# Patient Record
Sex: Female | Born: 1975 | Race: Black or African American | Hispanic: No | Marital: Single | Smoking: Never smoker
Health system: Southern US, Community
[De-identification: ages and names within clinical notes are randomized; demographics above are authoritative.]

## PROBLEM LIST (undated history)

## (undated) DIAGNOSIS — I10 Essential (primary) hypertension: Secondary | ICD-10-CM

## (undated) DIAGNOSIS — O139 Gestational [pregnancy-induced] hypertension without significant proteinuria, unspecified trimester: Secondary | ICD-10-CM

## (undated) DIAGNOSIS — D649 Anemia, unspecified: Secondary | ICD-10-CM

## (undated) DIAGNOSIS — D219 Benign neoplasm of connective and other soft tissue, unspecified: Secondary | ICD-10-CM

---

## 2010-05-11 ENCOUNTER — Emergency Department (HOSPITAL_COMMUNITY)
Admission: EM | Admit: 2010-05-11 | Discharge: 2010-05-11 | Payer: Self-pay | Source: Home / Self Care | Admitting: Family Medicine

## 2010-05-12 ENCOUNTER — Encounter (INDEPENDENT_AMBULATORY_CARE_PROVIDER_SITE_OTHER): Payer: Self-pay | Admitting: Nurse Practitioner

## 2010-06-30 ENCOUNTER — Encounter: Payer: Self-pay | Admitting: Nurse Practitioner

## 2010-06-30 ENCOUNTER — Ambulatory Visit: Admit: 2010-06-30 | Payer: Self-pay | Admitting: Nurse Practitioner

## 2010-06-30 DIAGNOSIS — K029 Dental caries, unspecified: Secondary | ICD-10-CM | POA: Insufficient documentation

## 2010-07-01 NOTE — Letter (Signed)
Summary: DENTAL REFERRAL  DENTAL REFERRAL   Imported By: Arta Bruce 05/12/2010 16:37:26  _____________________________________________________________________  External Attachment:    Type:   Image     Comment:   External Document

## 2010-07-03 ENCOUNTER — Inpatient Hospital Stay (INDEPENDENT_AMBULATORY_CARE_PROVIDER_SITE_OTHER)
Admission: RE | Admit: 2010-07-03 | Discharge: 2010-07-03 | Disposition: A | Discharge status: Home / Self Care | Payer: Self-pay | Source: Ambulatory Visit | Attending: Internal Medicine | Admitting: Internal Medicine

## 2010-07-03 DIAGNOSIS — L02419 Cutaneous abscess of limb, unspecified: Secondary | ICD-10-CM

## 2010-07-07 LAB — CULTURE, ROUTINE-ABSCESS: Gram Stain: NONE SEEN

## 2010-07-07 NOTE — Letter (Signed)
Summary: DENTAL REFERRAL  DENTAL REFERRAL   Imported By: Arta Bruce 07/01/2010 10:15:37  _____________________________________________________________________  External Attachment:    Type:   Image     Comment:   External Document
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
## 2011-05-30 ENCOUNTER — Emergency Department (HOSPITAL_COMMUNITY)
Admission: EM | Admit: 2011-05-30 | Discharge: 2011-05-30 | Disposition: A | Discharge status: Home / Self Care | Payer: Self-pay | Source: Home / Self Care

## 2011-05-30 ENCOUNTER — Encounter: Payer: Self-pay | Admitting: *Deleted

## 2011-05-30 DIAGNOSIS — J309 Allergic rhinitis, unspecified: Secondary | ICD-10-CM

## 2011-05-30 DIAGNOSIS — J209 Acute bronchitis, unspecified: Secondary | ICD-10-CM

## 2011-05-30 MED ORDER — BENZONATATE 100 MG PO CAPS: ORAL_CAPSULE | ORAL | Status: AC

## 2011-05-30 MED ORDER — PREDNISONE 20 MG PO TABS: 20.0000 mg | ORAL_TABLET | Freq: Every day | ORAL | Status: AC

## 2011-05-30 NOTE — ED Provider Notes (Signed)
Medical screening examination/treatment/procedure(s) were performed by non-physician practitioner and as supervising physician I was immediately available for consultation/collaboration.  Pragya Lofaso   Mylik Pro, MD 05/30/11 1048 

## 2011-05-30 NOTE — ED Notes (Signed)
Pt states she had a cough back in November, got better, but then last week started with sinus congestion and cough again.  Denies fever, sorethroat.  States when she coughs, she sometimes gets [place] clear phlegm.

## 2011-05-30 NOTE — ED Provider Notes (Signed)
History     CSN: [id number]  Arrival date & time 05/30/11  8657   None     Chief Complaint  Patient presents with  . Cough    (Consider location/radiation/quality/duration/timing/severity/associated sxs/prior treatment) HPI Comments: Pt states she has been sick off and on with cough and nasal congestion since mid November. Cough returned over a week ago. Is mostly nonproductive but occasionally is productive with clear phlegm. She also has nasal congestion. No fever, chills or sore throat. She has been taking otc cold and cough medications with some relief. She denies dyspnea or wheezing.   The history is provided by the patient.    History reviewed. No pertinent past medical history.  History reviewed. No pertinent past surgical history.  History reviewed. No pertinent family history.  History  Substance Use Topics  . Smoking status: Never Smoker   . Smokeless tobacco: Not on file  . Alcohol Use: No    OB History    Grav Para Term Preterm Abortions TAB SAB Ect Mult Living                  Review of Systems  Constitutional: Negative for fever, chills and fatigue.  HENT: Positive for congestion, rhinorrhea and postnasal drip. Negative for ear pain, sore throat, sneezing and sinus pressure.   Respiratory: Positive for cough. Negative for shortness of breath and wheezing.   Cardiovascular: Negative for chest pain and palpitations.    Allergies  Sulfonamide derivatives  Home Medications   Current Outpatient Rx  Name Route Sig Dispense Refill  . ALKA-SELTZER PLUS COLD PO Oral Take by mouth.      . SUDAFED COUGH PO Oral Take by mouth.      . BENZONATATE 100 MG PO CAPS  1-2 caps every 8 hrs prn cough 30 capsule 0  . PREDNISONE 20 MG PO TABS Oral Take 1 tablet (20 mg total) by mouth daily. 5 tablet 0    BP 126/80  Pulse 72  Temp(Src) 98.2 F (36.8 C) (Oral)  Resp 16  SpO2 100%  LMP 05/23/2011  Physical Exam  Nursing note and vitals  reviewed. Constitutional: She appears well-developed and well-nourished. No distress.  HENT:  Head: Normocephalic and atraumatic.  Right Ear: Tympanic membrane, external ear and ear canal normal.  Left Ear: Tympanic membrane, external ear and ear canal normal.  Nose: Mucosal edema (severely swollen and pale bilat) present. No rhinorrhea, nasal deformity or septal deviation.  Mouth/Throat: Uvula is midline, oropharynx is clear and moist and mucous membranes are normal. No oropharyngeal exudate, posterior oropharyngeal edema or posterior oropharyngeal erythema.  Neck: Neck supple.  Cardiovascular: Normal rate, regular rhythm and normal heart sounds.   Pulmonary/Chest: Effort normal and breath sounds normal. No respiratory distress.  Lymphadenopathy:    She has no cervical adenopathy.  Neurological: She is alert.  Skin: Skin is warm and dry.  Psychiatric: She has a normal mood and affect.    ED Course  Procedures (including critical care time)  Labs Reviewed - No data to display No results found.   1. Acute bronchitis   2. Allergic rhinitis       MDM  Recurrent cough and allergic rhinitis.         Melody Comas, [place] 05/30/11 1044

## 2012-01-15 ENCOUNTER — Encounter (HOSPITAL_COMMUNITY): Payer: Self-pay

## 2012-01-15 ENCOUNTER — Emergency Department (HOSPITAL_COMMUNITY)
Admission: EM | Admit: 2012-01-15 | Discharge: 2012-01-15 | Disposition: A | Discharge status: Home / Self Care | Payer: Self-pay | Source: Home / Self Care

## 2012-01-15 DIAGNOSIS — S39012A Strain of muscle, fascia and tendon of lower back, initial encounter: Secondary | ICD-10-CM

## 2012-01-15 DIAGNOSIS — S335XXA Sprain of ligaments of lumbar spine, initial encounter: Secondary | ICD-10-CM

## 2012-01-15 LAB — POCT URINALYSIS DIP (DEVICE)
Bilirubin Urine: NEGATIVE
Glucose, UA: NEGATIVE mg/dL
Hgb urine dipstick: NEGATIVE
Ketones, ur: NEGATIVE mg/dL
Specific Gravity, Urine: 1.01 (ref 1.005–1.030)
pH: 6 (ref 5.0–8.0)

## 2012-01-15 MED ORDER — METHYLPREDNISOLONE 4 MG PO KIT: PACK | ORAL | Status: AC

## 2012-01-15 MED ORDER — IBUPROFEN 800 MG PO TABS
ORAL_TABLET | ORAL | Status: AC
  Filled 2012-01-15: qty 1

## 2012-01-15 MED ORDER — NAPROXEN 500 MG PO TABS: 500.0000 mg | ORAL_TABLET | Freq: Two times a day (BID) | ORAL | Status: AC

## 2012-01-15 MED ORDER — CYCLOBENZAPRINE HCL 10 MG PO TABS: 10.0000 mg | ORAL_TABLET | Freq: Two times a day (BID) | ORAL | Status: AC | PRN

## 2012-01-15 MED ORDER — IBUPROFEN 800 MG PO TABS
800.0000 mg | ORAL_TABLET | Freq: Once | ORAL | Status: AC
  Administered 2012-01-15: 800 mg via ORAL

## 2012-01-15 NOTE — ED Provider Notes (Signed)
Medical screening examination/treatment/procedure(s) were performed by non-physician practitioner and as supervising physician I was immediately available for consultation/collaboration.  Bravlio Luca, M.D.   Katheryn Culliton C Carol Theys, MD 01/15/12 1854 

## 2012-01-15 NOTE — ED Notes (Signed)
Pt has low back pain that started one week ago and becomes very stiff with driving or lying in bed.  No known injury.

## 2012-01-15 NOTE — ED Provider Notes (Signed)
History     CSN: [id number]  Arrival date & time 01/15/12  1605   None     Chief Complaint  Patient presents with  . Back Pain    (Consider location/radiation/quality/duration/timing/severity/associated sxs/prior treatment) Patient is a 36 y.o. female presenting with back pain. The history is provided by the patient.  Back Pain   Bethany Wong is a 36 y.o. female who complains of right low back pain described as intermittent sharp in nature that began 7 days ago. The pain is aggravated with standing and walking, with intermittent radiation down the right leg. No known injury noted.  Works in [employer] with standing on concrete floor.  Denies history of back problems. There is no associated numbness or weakness in lower extremities.  Has not taken medication for pain relief.  Denies urinary symptoms.  Continent of both bowel and bladder.  Pain is 8/10. No red flags such as fevers, age >68, h/o trauma with bony tenderness, neurological deficits, h/o CA, unexplained weight loss, pain worse at night, pain at rest,  h/o prolonged steroid use or h/o osteopenia.     History reviewed. No pertinent past medical history.  History reviewed. No pertinent past surgical history.  History reviewed. No pertinent family history.  History  Substance Use Topics  . Smoking status: Never Smoker   . Smokeless tobacco: Not on file  . Alcohol Use: No    OB History    Grav Para Term Preterm Abortions TAB SAB Ect Mult Living                  Review of Systems  Constitutional: Negative.   Respiratory: Negative.   Cardiovascular: Negative.   Gastrointestinal: Negative.   Genitourinary: Negative.   Musculoskeletal: Positive for back pain. Negative for myalgias, joint swelling, arthralgias and gait problem.  Neurological: Negative.     Allergies  Sulfonamide derivatives  Home Medications   Current Outpatient Rx  Name Route Sig Dispense Refill  . ALKA-SELTZER PLUS COLD PO Oral Take by  mouth.      . CYCLOBENZAPRINE HCL 10 MG PO TABS Oral Take 1 tablet (10 mg total) by mouth 2 (two) times daily as needed for muscle spasms. 20 tablet 1  . METHYLPREDNISOLONE 4 MG PO KIT  follow package directions 21 tablet 0  . NAPROXEN 500 MG PO TABS Oral Take 1 tablet (500 mg total) by mouth 2 (two) times daily. 30 tablet 2  . SUDAFED COUGH PO Oral Take by mouth.        BP 116/72  Pulse 82  Temp 98.5 F (36.9 C) (Oral)  Resp 16  SpO2 96%  LMP 01/02/2012  Physical Exam  Nursing note and vitals reviewed. Constitutional: She is oriented to person, place, and time. Vital signs are normal. She appears well-developed and well-nourished. She is active and cooperative.  HENT:  Head: Normocephalic.  Eyes: Conjunctivae are normal. Pupils are equal, round, and reactive to light. No scleral icterus.  Neck: Trachea normal and normal range of motion. Neck supple.  Cardiovascular: Normal rate and regular rhythm.   Pulmonary/Chest: Effort normal and breath sounds normal.  Musculoskeletal:       Right shoulder: Normal.       Left shoulder: Normal.       Right hip: Normal.       Left hip: Normal.       Right ankle: Normal. Achilles tendon normal.       Left ankle: Normal. Achilles tendon normal.  Cervical back: Normal.       Thoracic back: Normal.       Lumbar back: She exhibits tenderness and spasm. She exhibits normal range of motion, no bony tenderness, no swelling, no edema, no deformity and no pain.       right lower paravertebral spasm. Lumbosacral spine area reveals no local tenderness or mass.  No painful or reduced ROM noted. Straight leg raise is negative.  DTR's, motor strength and sensation normal, including heel and toe gait.  Peripheral pulses are palpable.   Neurological: She is alert and oriented to person, place, and time. She has normal strength. She displays normal reflexes. No cranial nerve deficit or sensory deficit. She exhibits normal muscle tone. Coordination and gait  normal.       Bilateral equal strength in all extremities, sensation intact  Skin: Skin is warm and dry.  Psychiatric: She has a normal mood and affect. Her speech is normal and behavior is normal. Judgment and thought content normal. Cognition and memory are normal.    ED Course  Procedures (including critical care time)   Labs Reviewed  POCT URINALYSIS DIP (DEVICE)  POCT PREGNANCY, URINE   No results found.   1. Lumbar strain       MDM  Typical low back pain, has been <6 week duration. Rest, intermittent application of cold packs (later, may switch to heat, but do not sleep on heating pad), analgesics and muscle relaxants as recommended. Discussed longer term treatment plan of prn NSAID's and discussed a home back care exercise program with flexion exercise routine. Proper lifting with avoidance of heavy lifting discussed. Will need to ensure you have proper shoes to wear during work.   Call or return to clinic prn if these symptoms worsen or fail to improve as anticipated. Imaging not indicated at this time.         Johnsie Kindred, NP 01/15/12 1839

## 2012-05-17 ENCOUNTER — Emergency Department (INDEPENDENT_AMBULATORY_CARE_PROVIDER_SITE_OTHER)
Admission: EM | Admit: 2012-05-17 | Discharge: 2012-05-17 | Disposition: A | Discharge status: Home / Self Care | Payer: Self-pay | Source: Home / Self Care | Attending: Family Medicine | Admitting: Family Medicine

## 2012-05-17 ENCOUNTER — Encounter (HOSPITAL_COMMUNITY): Payer: Self-pay | Admitting: Emergency Medicine

## 2012-05-17 DIAGNOSIS — S39012A Strain of muscle, fascia and tendon of lower back, initial encounter: Secondary | ICD-10-CM

## 2012-05-17 DIAGNOSIS — S335XXA Sprain of ligaments of lumbar spine, initial encounter: Secondary | ICD-10-CM

## 2012-05-17 MED ORDER — IBUPROFEN 800 MG PO TABS: 800.0000 mg | ORAL_TABLET | Freq: Three times a day (TID) | ORAL | Status: DC
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
## 2012-05-18 NOTE — ED Provider Notes (Signed)
Medical screening examination/treatment/procedure(s) were performed by resident physician or non-physician practitioner and as supervising physician I was immediately available for consultation/collaboration.   Elayjah Chaney DOUGLAS MD.    Frannie Shedrick D Supreme Rybarczyk, MD 05/18/12 1132 

## 2013-07-10 ENCOUNTER — Other Ambulatory Visit: Payer: Self-pay | Admitting: *Deleted

## 2013-07-10 DIAGNOSIS — Z9289 Personal history of other medical treatment: Secondary | ICD-10-CM

## 2013-07-10 LAB — READ PPD: TB Skin Test: NEGATIVE

## 2015-05-04 ENCOUNTER — Other Ambulatory Visit (HOSPITAL_COMMUNITY): Payer: Self-pay | Admitting: Obstetrics & Gynecology

## 2015-05-04 DIAGNOSIS — Z3169 Encounter for other general counseling and advice on procreation: Secondary | ICD-10-CM

## 2015-05-06 ENCOUNTER — Ambulatory Visit (HOSPITAL_COMMUNITY)
Admission: RE | Admit: 2015-05-06 | Discharge: 2015-05-06 | Disposition: A | Discharge status: Home / Self Care | Payer: 59 | Source: Ambulatory Visit | Attending: Obstetrics & Gynecology | Admitting: Obstetrics & Gynecology

## 2015-05-06 DIAGNOSIS — N979 Female infertility, unspecified: Secondary | ICD-10-CM | POA: Diagnosis present

## 2015-05-06 DIAGNOSIS — Z3169 Encounter for other general counseling and advice on procreation: Secondary | ICD-10-CM

## 2015-05-06 MED ORDER — IOHEXOL 300 MG/ML  SOLN: 30.0000 mL | Freq: Once | INTRAMUSCULAR | Status: DC | PRN

## 2015-05-06 MED ORDER — IOHEXOL 300 MG/ML  SOLN: 20.0000 mL | Freq: Once | INTRAMUSCULAR | Status: DC | PRN

## 2015-05-07 MED ORDER — IOHEXOL 300 MG/ML  SOLN: 30.0000 mL | Freq: Once | INTRAMUSCULAR | Status: AC | PRN

## 2015-05-22 ENCOUNTER — Emergency Department (HOSPITAL_COMMUNITY)
Admission: EM | Admit: 2015-05-22 | Discharge: 2015-05-22 | Disposition: A | Discharge status: Home / Self Care | Payer: 59 | Source: Home / Self Care | Attending: Family Medicine | Admitting: Family Medicine

## 2015-05-22 ENCOUNTER — Encounter (HOSPITAL_COMMUNITY): Payer: Self-pay | Admitting: Emergency Medicine

## 2015-05-22 DIAGNOSIS — N944 Primary dysmenorrhea: Secondary | ICD-10-CM | POA: Diagnosis not present

## 2015-05-22 LAB — POCT URINALYSIS DIP (DEVICE)
Bilirubin Urine: NEGATIVE
GLUCOSE, UA: 100 mg/dL — AB
KETONES UR: NEGATIVE mg/dL
Leukocytes, UA: NEGATIVE
Nitrite: NEGATIVE
PROTEIN: 100 mg/dL — AB
Specific Gravity, Urine: 1.02 (ref 1.005–1.030)
UROBILINOGEN UA: 4 mg/dL — AB (ref 0.0–1.0)
pH: 7 (ref 5.0–8.0)

## 2015-05-22 LAB — POCT PREGNANCY, URINE: PREG TEST UR: NEGATIVE

## 2015-05-22 MED ORDER — KETOROLAC TROMETHAMINE 30 MG/ML IJ SOLN
30.0000 mg | Freq: Once | INTRAMUSCULAR | Status: AC
  Administered 2015-05-22: 30 mg via INTRAMUSCULAR

## 2015-05-22 MED ORDER — KETOROLAC TROMETHAMINE 30 MG/ML IJ SOLN
INTRAMUSCULAR | Status: AC
  Filled 2015-05-22: qty 1

## 2015-05-22 MED ORDER — KETOROLAC TROMETHAMINE 10 MG PO TABS: 10.0000 mg | ORAL_TABLET | Freq: Four times a day (QID) | ORAL | Status: DC | PRN

## 2015-05-22 NOTE — ED Notes (Signed)
"  heavy pain" in lower abdomen.  Patient reports currently menstruating. This cycle started earlier than expected.  This period is heavier than usual.  Patient is trying to get pregnant and is currently taking medication to do so.

## 2015-05-22 NOTE — ED Provider Notes (Signed)
CSN: [id number]     Arrival date & time 05/22/15  1918 History   First MD Initiated Contact with Patient 05/22/15 1925     Chief Complaint  Patient presents with  . Abdominal Pain   (Consider location/radiation/quality/duration/timing/severity/associated sxs/prior Treatment) Patient is a 39 y.o. female presenting with vaginal bleeding. The history is provided by the patient.  Vaginal Bleeding Quality:  Heavier than menses and bright red Severity:  Moderate Onset quality:  Gradual Duration:  2 days Progression:  Worsening Chronicity:  New Menstrual history:  Irregular Possible pregnancy: yes   Context comment:  Nulliparous, ,lmp11/29, no birth control, trying to conceive, also with urinary sx. Relieved by:  None tried Worsened by:  Nothing tried Ineffective treatments:  None tried Associated symptoms: dysuria and fever     History reviewed. No pertinent past medical history. History reviewed. No pertinent past surgical history. No family history on file. Social History  Substance Use Topics  . Smoking status: Never Smoker   . Smokeless tobacco: None  . Alcohol Use: No   OB History    No data available     Review of Systems  Constitutional: Positive for fever. Negative for chills.  Genitourinary: Positive for dysuria, vaginal bleeding, menstrual problem and pelvic pain. Negative for flank pain.  All other systems reviewed and are negative.   Allergies  Sulfonamide derivatives  Home Medications   Prior to Admission medications   Medication Sig Start Date End Date Taking? Authorizing Provider  Chlorphen-Phenyleph-ASA (ALKA-SELTZER PLUS COLD PO) Take by mouth.      Historical Provider, MD  ibuprofen (ADVIL,MOTRIN) 800 MG tablet Take 1 tablet (800 mg total) by mouth 3 (three) times daily. 05/17/12   Fransico Meadow, PA-C  ketorolac (TORADOL) 10 MG tablet Take 1 tablet (10 mg total) by mouth every 6 (six) hours as needed. For menstrual pains 05/22/15   Billy Fischer, MD   Pseudoephedrine-DM-GG (SUDAFED COUGH PO) Take by mouth.      Historical Provider, MD   Meds Ordered and Administered this Visit   Medications  ketorolac (TORADOL) 30 MG/ML injection 30 mg (not administered)    BP 141/88 mmHg  Pulse 82  Temp(Src) 101.1 F (38.4 C) (Oral)  SpO2 95%  LMP 04/28/2015 (Exact Date) No data found.   Physical Exam  Constitutional: She is oriented to person, place, and time. She appears well-developed and well-nourished. She appears distressed.  Abdominal: Soft. Bowel sounds are normal. She exhibits no distension. There is tenderness.  Neurological: She is alert and oriented to person, place, and time.  Skin: Skin is warm and dry.  Nursing note and vitals reviewed.   ED Course  Procedures (including critical care time)  Labs Review Labs Reviewed  POCT URINALYSIS DIP (DEVICE) - Abnormal; Notable for the following:    Glucose, UA 100 (*)    Hgb urine dipstick LARGE (*)    Protein, ur 100 (*)    Urobilinogen, UA 4.0 (*)    All other components within normal limits  POCT PREGNANCY, URINE   U/a bld from menses U/preg neg. Imaging Review No results found.   Visual Acuity Review  Right Eye Distance:   Left Eye Distance:   Bilateral Distance:    Right Eye Near:   Left Eye Near:    Bilateral Near:         MDM   1. Primary dysmenorrhea        Billy Fischer, MD 05/22/15 [phone]

## 2015-05-22 NOTE — Discharge Instructions (Signed)
Use medicine as needed, drink plenty of water, [facility] or call your doctor if further problems

## 2015-05-25 ENCOUNTER — Encounter (HOSPITAL_COMMUNITY): Payer: Self-pay | Admitting: Emergency Medicine

## 2015-05-25 DIAGNOSIS — Z791 Long term (current) use of non-steroidal anti-inflammatories (NSAID): Secondary | ICD-10-CM | POA: Insufficient documentation

## 2015-05-25 DIAGNOSIS — Z3202 Encounter for pregnancy test, result negative: Secondary | ICD-10-CM | POA: Insufficient documentation

## 2015-05-25 DIAGNOSIS — A64 Unspecified sexually transmitted disease: Secondary | ICD-10-CM | POA: Diagnosis not present

## 2015-05-25 DIAGNOSIS — R61 Generalized hyperhidrosis: Secondary | ICD-10-CM | POA: Diagnosis not present

## 2015-05-25 DIAGNOSIS — Z79899 Other long term (current) drug therapy: Secondary | ICD-10-CM | POA: Diagnosis not present

## 2015-05-25 DIAGNOSIS — N760 Acute vaginitis: Secondary | ICD-10-CM | POA: Diagnosis not present

## 2015-05-25 DIAGNOSIS — R1031 Right lower quadrant pain: Secondary | ICD-10-CM | POA: Diagnosis present

## 2015-05-25 LAB — COMPREHENSIVE METABOLIC PANEL
ALK PHOS: 67 U/L (ref 38–126)
ALT: 25 U/L (ref 14–54)
AST: 31 U/L (ref 15–41)
Albumin: 3 g/dL — ABNORMAL LOW (ref 3.5–5.0)
Anion gap: 9 (ref 5–15)
BUN: 9 mg/dL (ref 6–20)
CALCIUM: 8.7 mg/dL — AB (ref 8.9–10.3)
CO2: 24 mmol/L (ref 22–32)
CREATININE: 0.99 mg/dL (ref 0.44–1.00)
Chloride: 106 mmol/L (ref 101–111)
GFR calc non Af Amer: >60 mL/min (ref >60)
Glucose, Bld: 130 mg/dL — ABNORMAL HIGH (ref 65–99)
Potassium: 3.7 mmol/L (ref 3.5–5.1)
SODIUM: 139 mmol/L (ref 135–145)
Total Bilirubin: 0.5 mg/dL (ref 0.3–1.2)
Total Protein: 6.8 g/dL (ref 6.5–8.1)

## 2015-05-25 LAB — URINALYSIS, ROUTINE W REFLEX MICROSCOPIC
BILIRUBIN URINE: NEGATIVE
Glucose, UA: NEGATIVE mg/dL
Hgb urine dipstick: NEGATIVE
Ketones, ur: NEGATIVE mg/dL
LEUKOCYTES UA: NEGATIVE
NITRITE: NEGATIVE
PH: 5.5 (ref 5.0–8.0)
Protein, ur: NEGATIVE mg/dL
SPECIFIC GRAVITY, URINE: 1.024 (ref 1.005–1.030)

## 2015-05-25 LAB — CBC
HCT: 34.4 % — ABNORMAL LOW (ref 36.0–46.0)
Hemoglobin: 11.1 g/dL — ABNORMAL LOW (ref 12.0–15.0)
MCH: 26.8 pg (ref 26.0–34.0)
MCHC: 32.3 g/dL (ref 30.0–36.0)
MCV: 83.1 fL (ref 78.0–100.0)
Platelets: 258 10*3/uL (ref 150–400)
RBC: 4.14 MIL/uL (ref 3.87–5.11)
RDW: 13 % (ref 11.5–15.5)
WBC: 7 10*3/uL (ref 4.0–10.5)

## 2015-05-25 LAB — POC URINE PREG, ED: PREG TEST UR: NEGATIVE

## 2015-05-25 NOTE — ED Notes (Signed)
Pt reports RLQ pain x 1 week with pressure when she urinates. Sts she initially did have abnormal vaginal discharge as well.

## 2015-05-26 ENCOUNTER — Emergency Department (HOSPITAL_COMMUNITY)
Admission: EM | Admit: 2015-05-26 | Discharge: 2015-05-26 | Disposition: A | Discharge status: Home / Self Care | Payer: 59 | Attending: Emergency Medicine | Admitting: Emergency Medicine

## 2015-05-26 DIAGNOSIS — N760 Acute vaginitis: Secondary | ICD-10-CM

## 2015-05-26 DIAGNOSIS — B9689 Other specified bacterial agents as the cause of diseases classified elsewhere: Secondary | ICD-10-CM

## 2015-05-26 DIAGNOSIS — A64 Unspecified sexually transmitted disease: Secondary | ICD-10-CM

## 2015-05-26 LAB — WET PREP, GENITAL
Sperm: NONE SEEN
Trich, Wet Prep: NONE SEEN
YEAST WET PREP: NONE SEEN

## 2015-05-26 MED ORDER — METRONIDAZOLE 500 MG PO TABS: 500.0000 mg | ORAL_TABLET | Freq: Two times a day (BID) | ORAL | Status: DC

## 2015-05-26 MED ORDER — AZITHROMYCIN 250 MG PO TABS
1000.0000 mg | ORAL_TABLET | Freq: Once | ORAL | Status: AC
  Administered 2015-05-26: 1000 mg via ORAL
  Filled 2015-05-26: qty 4

## 2015-05-26 MED ORDER — CEFTRIAXONE SODIUM 250 MG IJ SOLR
250.0000 mg | Freq: Once | INTRAMUSCULAR | Status: AC
  Administered 2015-05-26: 250 mg via INTRAMUSCULAR
  Filled 2015-05-26: qty 250

## 2015-05-26 NOTE — ED Provider Notes (Signed)
CSN: [id number]     Arrival date & time 05/25/15  2159 History  By sig .ning my name below, I, Building services engineer, attest that this documentation has been prepared under the direction and in the presence of Everlene Balls, MD. Electronically Signed: Helane Gunther, ED Scribe. 05/26/2015. 2:57 AM.     Chief Complaint  Patient presents with  . Abdominal Pain   The history is provided by the patient. No language interpreter was used.   HPI Comments: Bethany Wong is a 39 y.o. female who presents to the Emergency Department complaining of cramping, RLQ abdominal pain onset 1 week ago. She notes she ws seen at Urgent Care last week where she was given toradol for pain and advised to go to the ED if the pain continued. She reports associated diaphoresis and vaginal discharge. She notes alleviation of the pain with urination. She notes her most recent mestrual period was "a llittle early." Pt denies nausea, vomiting, diarrhea, and loss of appetite.    History reviewed. No pertinent past medical history. History reviewed. No pertinent past surgical history. No family history on file. Social History  Substance Use Topics  . Smoking status: Never Smoker   . Smokeless tobacco: None  . Alcohol Use: No   OB History    No data available     Review of Systems A complete 10 system review of systems was obtained and all systems are negative except as noted in the HPI and PMH.   Allergies  Sulfonamide derivatives  Home Medications   Prior to Admission medications   Medication Sig Start Date End Date Taking? Authorizing Provider  Chlorphen-Phenyleph-ASA (ALKA-SELTZER PLUS COLD PO) Take by mouth.      Historical Provider, MD  ClomiPHENE Citrate (CLOMID PO) Take by mouth.    Historical Provider, MD  ibuprofen (ADVIL,MOTRIN) 800 MG tablet Take 1 tablet (800 mg total) by mouth 3 (three) times daily. 05/17/12   Fransico Meadow, PA-C  ketorolac (TORADOL) 10 MG tablet Take 1 tablet (10 mg total) by mouth  every 6 (six) hours as needed. For menstrual pains 05/22/15   Billy Fischer, MD  Pseudoephedrine-DM-GG (SUDAFED COUGH PO) Take by mouth.      Historical Provider, MD   BP 126/78 mmHg  Pulse 77  Temp(Src) 99.3 F (37.4 C) (Oral)  Resp 19  Ht 5\' 6"  (1.676 m)  Wt 197 lb 6.4 oz (89.54 kg)  BMI 31.88 kg/m2  SpO2 100%  LMP 05/20/2015 Physical Exam  Constitutional: She is oriented to person, place, and time. She appears well-developed and well-nourished. No distress.  HENT:  Head: Normocephalic and atraumatic.  Nose: Nose normal.  Mouth/Throat: Oropharynx is clear and moist. No oropharyngeal exudate.  Eyes: Conjunctivae and EOM are normal. Pupils are equal, round, and reactive to light. No scleral icterus.  Neck: Normal range of motion. Neck supple. No JVD present. No tracheal deviation present. No thyromegaly present.  Cardiovascular: Normal rate, regular rhythm and normal heart sounds.  Exam reveals no gallop and no friction rub.   No murmur heard. Pulmonary/Chest: Effort normal and breath sounds normal. No respiratory distress. She has no wheezes. She exhibits no tenderness.  Abdominal: Soft. Bowel sounds are normal. She exhibits no distension and no mass. There is tenderness. There is no rebound and no guarding.  Suprapubic TTP  Genitourinary: Vaginal discharge found.  Copious, purulent discharge coming from the cervix, no CMT, no adnexal TTP  Musculoskeletal: Normal range of motion. She exhibits no edema or tenderness.  Lymphadenopathy:    She has no cervical adenopathy.  Neurological: She is alert and oriented to person, place, and time. No cranial nerve deficit. She exhibits normal muscle tone.  Skin: Skin is warm and dry. No rash noted. No erythema. No pallor.  Nursing note and vitals reviewed.   ED Course  Procedures  DIAGNOSTIC STUDIES: Oxygen Saturation is 100% on RA, normal by my interpretation.    COORDINATION OF CARE: 2:49 AM - Discussed normal lab results. Discussed  plans to perform a pelvic exam. Pt advised of plan for treatment and pt agrees.  Labs Review Labs Reviewed  WET PREP, GENITAL - Abnormal; Notable for the following:    Clue Cells Wet Prep HPF POC PRESENT (*)    WBC, Wet Prep HPF POC MODERATE (*)    All other components within normal limits  COMPREHENSIVE METABOLIC PANEL - Abnormal; Notable for the following:    Glucose, Bld 130 (*)    Calcium 8.7 (*)    Albumin 3.0 (*)    All other components within normal limits  CBC - Abnormal; Notable for the following:    Hemoglobin 11.1 (*)    HCT 34.4 (*)    All other components within normal limits  URINALYSIS, ROUTINE W REFLEX MICROSCOPIC (NOT AT [facility]) - Abnormal; Notable for the following:    Color, Urine AMBER (*)    All other components within normal limits  POC URINE PREG, ED  GC/CHLAMYDIA PROBE AMP ([place]) NOT AT [facility]    Imaging Review No results found. I have personally reviewed and evaluated these images and lab results as part of my medical decision-making.   EKG Interpretation None      MDM   Final diagnoses:  None   Patient presents to the emergency department for worsening abdominal pain.Pelvic exam reveals a likely cervicitis. She was treated with ceftriaxone and doxycycline. Also clue cells on the wet prep, will discharge with Flagyl to take. Primary care follow-up advised. She appears well and in no acute distress, vital signs within her normal limits and she is safe for discharge   I personally performed the services described in this documentation, which was scribed in my presence. The recorded information has been reviewed and is accurate.     Everlene Balls, MD 05/26/15 [phone]

## 2015-05-26 NOTE — Discharge Instructions (Signed)
Bacterial Vaginosis [name], take antibiotics as directed for your infection. See primary care physician within 3 days for close follow-up. If any symptoms worsen come back to emergency department immediately. Thank you.Bacterial vaginosis is an infection of the vagina. It happens when too many germs (bacteria) grow in the vagina. Having this infection puts you at risk for getting other infections from sex. Treating this infection can help lower your risk for other infections, such as:   Chlamydia.  Gonorrhea.  HIV.  Herpes. HOME CARE  Take your medicine as told by your doctor.  Finish your medicine even if you start to feel better.  Tell your sex partner that you have an infection. They should see their doctor for treatment.  During treatment:  Avoid sex or use condoms correctly.  Do not douche.  Do not drink alcohol unless your doctor tells you it is ok.  Do not breastfeed unless your doctor tells you it is ok. GET HELP IF:  You are not getting better after 3 days of treatment.  You have more grey fluid (discharge) coming from your vagina than before.  You have more pain than before.  You have a fever. MAKE SURE YOU:   Understand these instructions.  Will watch your condition.  Will get help right away if you are not doing well or get worse.   This information is not intended to replace advice given to you by your health care provider. Make sure you discuss any questions you have with your health care provider.   Document Released: 02/23/2008 Document Revised: 06/06/2014 Document Reviewed: 12/26/2012 Elsevier Interactive Patient Education 2016 [employer]. Sexually Transmitted Disease A sexually transmitted disease (STD) is a disease or infection often passed to another person during sex. However, STDs can be passed through nonsexual ways. An STD can be passed through:  Spit (saliva).  Semen.  Blood.  Mucus from the vagina.  Pee (urine). HOW CAN I  LESSEN MY CHANCES OF GETTING AN STD?  Use:  Latex condoms.  Water-soluble lubricants with condoms. Do not use petroleum jelly or oils.  Dental dams. These are small pieces of latex that are used as a barrier during oral sex.  Avoid having more than one sex partner.  Do not have sex with someone who has other sex partners.  Do not have sex with anyone you do not know or who is at high risk for an STD.  Avoid risky sex that can break your skin.  Do not have sex if you have open sores on your mouth or skin.  Avoid drinking too much alcohol or taking illegal drugs. Alcohol and drugs can affect your good judgment.  Avoid oral and anal sex acts.  Get shots (vaccines) for HPV and hepatitis.  If you are at risk of being infected with HIV, it is advised that you take a certain medicine daily to prevent HIV infection. This is called pre-exposure prophylaxis (PrEP). You may be at risk if:  You are a man who has sex with other men (MSM).  You are attracted to the opposite sex (heterosexual) and are having sex with more than one partner.  You take drugs with a needle.  You have sex with someone who has HIV.  Talk with your doctor about if you are at high risk of being infected with HIV. If you begin to take PrEP, get tested for HIV first. Get tested every 3 months for as long as you are taking PrEP.  Get tested for  STDs every year if you are sexually active. If you are treated for an STD, get tested again 3 months after you are treated. WHAT SHOULD I DO IF I THINK I HAVE AN STD?  See your doctor.  Tell your sex partner(s) that you have an STD. They should be tested and treated.  Do not have sex until your doctor says it is okay. WHEN SHOULD I GET HELP? Get help right away if:  You have bad belly (abdominal) pain.  You are a man and have puffiness (swelling) or pain in your testicles.  You are a woman and have puffiness in your vagina.   This information is not intended to  replace advice given to you by your health care provider. Make sure you discuss any questions you have with your health care provider.   Document Released: 06/23/2004 Document Revised: 06/06/2014 Document Reviewed: 11/09/2012 Elsevier Interactive Patient Education [employer].

## 2015-05-29 LAB — GC/CHLAMYDIA PROBE AMP (~~LOC~~) NOT AT ARMC
Chlamydia: NEGATIVE
NEISSERIA GONORRHEA: NEGATIVE

## 2016-01-04 ENCOUNTER — Encounter (HOSPITAL_COMMUNITY): Payer: Self-pay | Admitting: Family Medicine

## 2016-01-04 ENCOUNTER — Ambulatory Visit (HOSPITAL_COMMUNITY)
Admission: EM | Admit: 2016-01-04 | Discharge: 2016-01-04 | Disposition: A | Discharge status: Home / Self Care | Payer: BLUE CROSS/BLUE SHIELD | Attending: Emergency Medicine | Admitting: Emergency Medicine

## 2016-01-04 DIAGNOSIS — L03116 Cellulitis of left lower limb: Secondary | ICD-10-CM | POA: Diagnosis not present

## 2016-01-04 MED ORDER — CLINDAMYCIN HCL 300 MG PO CAPS: 300.0000 mg | ORAL_CAPSULE | Freq: Three times a day (TID) | ORAL | 0 refills | Status: DC

## 2016-01-04 NOTE — ED Triage Notes (Signed)
Pt here for abscess to left thigh area. Area red and swollen and hardened. sts she had another one on RLE that drained and healed.

## 2016-01-04 NOTE — ED Provider Notes (Signed)
CSN: [id number]     Arrival date & time 01/04/16  1929 History   First MD Initiated Contact with Patient 01/04/16 2057     Chief Complaint  Patient presents with  . Abscess   (Consider location/radiation/quality/duration/timing/severity/associated sxs/prior Treatment) 40 year old female complaining of a tender knot to the left anterolateral thigh developed this morning. She states it started off as a small bump that has increased in size rapidly. Denies systemic symptoms.      History reviewed. No pertinent past medical history. History reviewed. No pertinent surgical history. History reviewed. No pertinent family history. Social History  Substance Use Topics  . Smoking status: Never Smoker  . Smokeless tobacco: Never Used  . Alcohol use No   OB History    No data available     Review of Systems  Constitutional: Negative.   HENT: Negative.   Gastrointestinal: Negative.   Musculoskeletal: Negative.   Skin:       As per history of present illness  Neurological: Negative.   All other systems reviewed and are negative.   Allergies  Sulfonamide derivatives  Home Medications   Prior to Admission medications   Medication Sig Start Date End Date Taking? Authorizing Provider  clindamycin (CLEOCIN) 300 MG capsule Take 1 capsule (300 mg total) by mouth 3 (three) times daily. 01/04/16   Janne Napoleon, NP  clomiPHENE (CLOMID) 50 MG tablet Take 50 mg by mouth daily.    Historical Provider, MD  ibuprofen (ADVIL,MOTRIN) 800 MG tablet Take 1 tablet (800 mg total) by mouth 3 (three) times daily. 05/17/12   Fransico Meadow, PA-C  ketorolac (TORADOL) 10 MG tablet Take 1 tablet (10 mg total) by mouth every 6 (six) hours as needed. For menstrual pains 05/22/15   Billy Fischer, MD  metroNIDAZOLE (FLAGYL) 500 MG tablet Take 1 tablet (500 mg total) by mouth 2 (two) times daily. One po bid x 7 days 05/26/15   Everlene Balls, MD   Meds Ordered and Administered this Visit  Medications - No data to  display  BP 142/91   Pulse 79   Temp 98.2 F (36.8 C)   Resp 18   LMP 12/26/2015   SpO2 98%  No data found.   Physical Exam  Constitutional: She is oriented to person, place, and time. She appears well-developed and well-nourished. No distress.  Eyes: EOM are normal.  Neck: Neck supple.  Cardiovascular: Normal rate.   Pulmonary/Chest: Effort normal. No respiratory distress.  Musculoskeletal: She exhibits no edema.  Neurological: She is alert and oriented to person, place, and time. She exhibits normal muscle tone.  Skin: Skin is warm and dry.  Left mid anterolateral thigh with an ovoid area of cutaneous erythema and underlying induration measuring approximately 3 cm. This area is not fluctuant. Positive for tenderness. No lymphangitis. No drainage or bleeding.  Psychiatric: She has a normal mood and affect.  Nursing note and vitals reviewed.   Urgent Care Course   Clinical Course    Procedures (including critical care time)  Labs Review Labs Reviewed - No data to display  Imaging Review No results found.   Visual Acuity Review  Right Eye Distance:   Left Eye Distance:   Bilateral Distance:    Right Eye Near:   Left Eye Near:    Bilateral Near:         MDM   1. Cellulitis of left lower extremity    Early abscess formation. Treat with warm compresses several times a day and  clindamycin as directed. Worsening or no improvement may return.    Janne Napoleon, NP 01/04/16 2132

## 2017-01-13 LAB — OB RESULTS CONSOLE RUBELLA ANTIBODY, IGM: Rubella: IMMUNE

## 2017-01-13 LAB — OB RESULTS CONSOLE RPR: RPR: NONREACTIVE

## 2017-01-13 LAB — OB RESULTS CONSOLE HEPATITIS B SURFACE ANTIGEN: Hepatitis B Surface Ag: NEGATIVE

## 2017-01-13 LAB — OB RESULTS CONSOLE GC/CHLAMYDIA
Chlamydia: NEGATIVE
GC PROBE AMP, GENITAL: NEGATIVE

## 2017-01-13 LAB — OB RESULTS CONSOLE HIV ANTIBODY (ROUTINE TESTING): HIV: NONREACTIVE

## 2017-07-05 LAB — OB RESULTS CONSOLE GBS: GBS: NEGATIVE

## 2017-07-27 ENCOUNTER — Encounter (HOSPITAL_COMMUNITY): Payer: Self-pay | Admitting: *Deleted

## 2017-07-27 ENCOUNTER — Inpatient Hospital Stay (HOSPITAL_COMMUNITY)
Admission: AD | Admit: 2017-07-27 | Discharge: 2017-08-01 | DRG: 787 | Disposition: A | Discharge status: Home / Self Care | Payer: BLUE CROSS/BLUE SHIELD | Source: Ambulatory Visit | Attending: Obstetrics | Admitting: Obstetrics

## 2017-07-27 DIAGNOSIS — O134 Gestational [pregnancy-induced] hypertension without significant proteinuria, complicating childbirth: Secondary | ICD-10-CM | POA: Diagnosis present

## 2017-07-27 DIAGNOSIS — D6959 Other secondary thrombocytopenia: Secondary | ICD-10-CM | POA: Diagnosis present

## 2017-07-27 DIAGNOSIS — D259 Leiomyoma of uterus, unspecified: Secondary | ICD-10-CM | POA: Diagnosis present

## 2017-07-27 DIAGNOSIS — Z3A38 38 weeks gestation of pregnancy: Secondary | ICD-10-CM | POA: Diagnosis not present

## 2017-07-27 DIAGNOSIS — O9912 Other diseases of the blood and blood-forming organs and certain disorders involving the immune mechanism complicating childbirth: Secondary | ICD-10-CM | POA: Diagnosis present

## 2017-07-27 DIAGNOSIS — O3413 Maternal care for benign tumor of corpus uteri, third trimester: Secondary | ICD-10-CM | POA: Diagnosis present

## 2017-07-27 DIAGNOSIS — O133 Gestational [pregnancy-induced] hypertension without significant proteinuria, third trimester: Secondary | ICD-10-CM | POA: Diagnosis present

## 2017-07-27 DIAGNOSIS — D696 Thrombocytopenia, unspecified: Secondary | ICD-10-CM | POA: Diagnosis present

## 2017-07-27 DIAGNOSIS — O139 Gestational [pregnancy-induced] hypertension without significant proteinuria, unspecified trimester: Secondary | ICD-10-CM | POA: Diagnosis present

## 2017-07-27 HISTORY — DX: Anemia, unspecified: D64.9

## 2017-07-27 HISTORY — DX: Essential (primary) hypertension: I10

## 2017-07-27 HISTORY — DX: Benign neoplasm of connective and other soft tissue, unspecified: D21.9

## 2017-07-27 HISTORY — DX: Gestational (pregnancy-induced) hypertension without significant proteinuria, unspecified trimester: O13.9

## 2017-07-27 LAB — CBC
HCT: 35.2 % — ABNORMAL LOW (ref 36.0–46.0)
Hemoglobin: 11.5 g/dL — ABNORMAL LOW (ref 12.0–15.0)
MCH: 27.5 pg (ref 26.0–34.0)
MCHC: 32.7 g/dL (ref 30.0–36.0)
MCV: 84.2 fL (ref 78.0–100.0)
PLATELETS: 113 10*3/uL — AB (ref 150–400)
RBC: 4.18 MIL/uL (ref 3.87–5.11)
RDW: 15.1 % (ref 11.5–15.5)
WBC: 6.2 10*3/uL (ref 4.0–10.5)

## 2017-07-27 LAB — COMPREHENSIVE METABOLIC PANEL
ALK PHOS: 136 U/L — AB (ref 38–126)
ALT: 15 U/L (ref 14–54)
AST: 27 U/L (ref 15–41)
Albumin: 2.4 g/dL — ABNORMAL LOW (ref 3.5–5.0)
Anion gap: 8 (ref 5–15)
BUN: 8 mg/dL (ref 6–20)
CALCIUM: 8.7 mg/dL — AB (ref 8.9–10.3)
CHLORIDE: 108 mmol/L (ref 101–111)
CO2: 20 mmol/L — AB (ref 22–32)
CREATININE: 0.59 mg/dL (ref 0.44–1.00)
GFR calc non Af Amer: >60 mL/min (ref >60)
GLUCOSE: 107 mg/dL — AB (ref 65–99)
Potassium: 3.7 mmol/L (ref 3.5–5.1)
SODIUM: 136 mmol/L (ref 135–145)
Total Bilirubin: 0.5 mg/dL (ref 0.3–1.2)
Total Protein: 5.5 g/dL — ABNORMAL LOW (ref 6.5–8.1)

## 2017-07-27 LAB — OB RESULTS CONSOLE HEPATITIS B SURFACE ANTIGEN: HEP B S AG: NEGATIVE

## 2017-07-27 LAB — PROTEIN / CREATININE RATIO, URINE
Creatinine, Urine: 175 mg/dL
PROTEIN CREATININE RATIO: 0.14 mg/mg{creat} (ref 0.00–0.15)
Total Protein, Urine: 25 mg/dL

## 2017-07-27 LAB — OB RESULTS CONSOLE GBS: STREP GROUP B AG: NEGATIVE

## 2017-07-27 LAB — OB RESULTS CONSOLE RPR
RPR: NONREACTIVE
RPR: NONREACTIVE

## 2017-07-27 LAB — OB RESULTS CONSOLE HIV ANTIBODY (ROUTINE TESTING): HIV: NONREACTIVE

## 2017-07-27 LAB — URIC ACID: Uric Acid, Serum: 4.6 mg/dL (ref 2.3–6.6)

## 2017-07-27 LAB — TYPE AND SCREEN
ABO/RH(D): O POS
Antibody Screen: NEGATIVE

## 2017-07-27 LAB — ABO/RH: ABO/RH(D): O POS

## 2017-07-27 MED ORDER — OXYCODONE-ACETAMINOPHEN 5-325 MG PO TABS: 2.0000 | ORAL_TABLET | ORAL | Status: DC | PRN

## 2017-07-27 MED ORDER — ONDANSETRON HCL 4 MG/2ML IJ SOLN: 4.0000 mg | Freq: Four times a day (QID) | INTRAMUSCULAR | Status: DC | PRN

## 2017-07-27 MED ORDER — MISOPROSTOL 25 MCG QUARTER TABLET
25.0000 ug | ORAL_TABLET | ORAL | Status: DC | PRN
  Administered 2017-07-27 – 2017-07-29 (×6): 25 ug via VAGINAL
  Filled 2017-07-27 (×6): qty 1

## 2017-07-27 MED ORDER — ACETAMINOPHEN 325 MG PO TABS: 650.0000 mg | ORAL_TABLET | ORAL | Status: DC | PRN

## 2017-07-27 MED ORDER — OXYCODONE-ACETAMINOPHEN 5-325 MG PO TABS: 1.0000 | ORAL_TABLET | ORAL | Status: DC | PRN

## 2017-07-27 MED ORDER — LACTATED RINGERS IV SOLN: 500.0000 mL | INTRAVENOUS | Status: DC | PRN

## 2017-07-27 MED ORDER — OXYTOCIN BOLUS FROM INFUSION: 500.0000 mL | Freq: Once | INTRAVENOUS | Status: DC

## 2017-07-27 MED ORDER — OXYTOCIN 10 UNIT/ML IJ SOLN: 10.0000 [IU] | Freq: Once | INTRAMUSCULAR | Status: DC

## 2017-07-27 MED ORDER — SOD CITRATE-CITRIC ACID 500-334 MG/5ML PO SOLN
30.0000 mL | ORAL | Status: DC | PRN
  Administered 2017-07-29: 30 mL via ORAL
  Filled 2017-07-27: qty 15

## 2017-07-27 MED ORDER — OXYTOCIN 40 UNITS IN LACTATED RINGERS INFUSION - SIMPLE MED: 2.5000 [IU]/h | INTRAVENOUS | Status: DC

## 2017-07-27 MED ORDER — TERBUTALINE SULFATE 1 MG/ML IJ SOLN: 0.2500 mg | Freq: Once | INTRAMUSCULAR | Status: DC | PRN

## 2017-07-27 MED ORDER — SOD CITRATE-CITRIC ACID 500-334 MG/5ML PO SOLN: 30.0000 mL | ORAL | Status: DC | PRN

## 2017-07-27 MED ORDER — LACTATED RINGERS IV SOLN: INTRAVENOUS | Status: DC

## 2017-07-27 MED ORDER — LIDOCAINE HCL (PF) 1 % IJ SOLN: 30.0000 mL | INTRAMUSCULAR | Status: DC | PRN

## 2017-07-27 MED ORDER — FLEET ENEMA 7-19 GM/118ML RE ENEM: 1.0000 | ENEMA | RECTAL | Status: DC | PRN

## 2017-07-27 MED ORDER — OXYTOCIN 40 UNITS IN LACTATED RINGERS INFUSION - SIMPLE MED
2.5000 [IU]/h | INTRAVENOUS | Status: DC
  Filled 2017-07-27: qty 1000

## 2017-07-27 MED ORDER — LACTATED RINGERS IV SOLN
INTRAVENOUS | Status: DC
  Administered 2017-07-27: 950 mL via INTRAVENOUS
  Administered 2017-07-28 (×2): via INTRAVENOUS
  Administered 2017-07-28: 975 mL via INTRAVENOUS
  Administered 2017-07-29 (×3): via INTRAVENOUS

## 2017-07-27 NOTE — H&P (Signed)
Bethany Wong is a 42 y.o. female presenting for IOL 2nd to gestational HTN  with associated low plt count. This is an IVF pregnancy. Pt was has no prior hx of HTN. She was being followed during pregnancy for gestational thrombocytopenia. Today, plt count was 101K, BP 149/88, 142/98 Pt denies h/a, visual changes or epigastric pain.  OB History    Gravida Para Term Preterm AB Living   1             SAB TAB Ectopic Multiple Live Births                 Past Medical History:  Diagnosis Date  . Anemia   . Fibroid   . Hypertension   . Pregnancy induced hypertension    History reviewed. No pertinent surgical history. Family History: family history includes Cancer in her maternal grandfather; Depression in her maternal grandmother; Diabetes in her mother. Social History:  reports that  has never smoked. she has never used smokeless tobacco. She reports that she does not drink alcohol or use drugs.     Maternal Diabetes: No Genetic Screening: Normal. Had Pre transfer genetic testing Maternal Ultrasounds/Referrals: Normal Fetal Ultrasounds or other Referrals:  None Maternal Substance Abuse:  No Significant Maternal Medications:  None Significant Maternal Lab Results:  Lab values include: Group B Strep negative Other Comments:  IVF preg, AMA  Review of Systems  Eyes: Negative for blurred vision.  Gastrointestinal: Negative for heartburn.   Maternal Medical History:  Fetal activity: Perceived fetal activity is normal.    Prenatal complications: Thrombocytopenia.   Prenatal Complications - Diabetes: none.    Dilation: Closed Effacement (%): Thick Station: -3 Exam by:: Bethany Brunette, RN Blood pressure (!) 139/96, pulse 90, temperature 98.3 F (36.8 C), temperature source Oral, resp. rate 18, height 5\' 6"  (1.676 m), weight 104.3 kg (230 lb). Exam Physical Exam  Constitutional: She is oriented to person, place, and time. She appears well-developed and well-nourished.  Eyes:  EOM are normal.  Neck: Neck supple.  Cardiovascular: Regular rhythm.  Respiratory: Breath sounds normal.  GI: Soft.  Musculoskeletal: She exhibits edema.  Neurological: She is alert and oriented to person, place, and time.  Skin: Skin is warm and dry.  Psychiatric: She has a normal mood and affect.    Prenatal labs: ABO, Rh: --/--/O POS (02/28 1717) Antibody: NEG (02/28 1717) Rubella:   RPR: Nonreactive, Nonreactive (02/28 1842)  HBsAg: Negative (02/28 1841)  HIV: Non-reactive (02/28 1842)  GBS: Negative (02/28 1841)   Assessment/Plan: Gestational HTN Thrombocytopenia, probably gestational IUP @ 38 1/7 weeks P) admit . [place] labs. Cytotec. Magnesium depending on labs. Pitocin augmentation prn. BP med prn  Ommie Degeorge A Constance Hackenberg 07/27/2017, 7:43 PM

## 2017-07-28 LAB — CBC
HEMATOCRIT: 37.1 % (ref 36.0–46.0)
HEMOGLOBIN: 12.3 g/dL (ref 12.0–15.0)
MCH: 27.6 pg (ref 26.0–34.0)
MCHC: 33.2 g/dL (ref 30.0–36.0)
MCV: 83.2 fL (ref 78.0–100.0)
Platelets: 111 10*3/uL — ABNORMAL LOW (ref 150–400)
RBC: 4.46 MIL/uL (ref 3.87–5.11)
RDW: 14.8 % (ref 11.5–15.5)
WBC: 8.2 10*3/uL (ref 4.0–10.5)

## 2017-07-28 LAB — COMPREHENSIVE METABOLIC PANEL
ALK PHOS: 153 U/L — AB (ref 38–126)
ALT: 17 U/L (ref 14–54)
AST: 25 U/L (ref 15–41)
Albumin: 2.6 g/dL — ABNORMAL LOW (ref 3.5–5.0)
Anion gap: 7 (ref 5–15)
BILIRUBIN TOTAL: 0.7 mg/dL (ref 0.3–1.2)
BUN: 8 mg/dL (ref 6–20)
CALCIUM: 9 mg/dL (ref 8.9–10.3)
CO2: 22 mmol/L (ref 22–32)
Chloride: 105 mmol/L (ref 101–111)
Creatinine, Ser: 0.66 mg/dL (ref 0.44–1.00)
GLUCOSE: 88 mg/dL (ref 65–99)
Potassium: 4 mmol/L (ref 3.5–5.1)
Sodium: 134 mmol/L — ABNORMAL LOW (ref 135–145)
TOTAL PROTEIN: 5.8 g/dL — AB (ref 6.5–8.1)

## 2017-07-28 LAB — RPR: RPR Ser Ql: NONREACTIVE

## 2017-07-28 MED ORDER — OXYTOCIN 40 UNITS IN LACTATED RINGERS INFUSION - SIMPLE MED
1.0000 m[IU]/min | INTRAVENOUS | Status: DC
  Administered 2017-07-28 – 2017-07-29 (×2): 2 m[IU]/min via INTRAVENOUS
  Filled 2017-07-28: qty 1000

## 2017-07-28 MED ORDER — TERBUTALINE SULFATE 1 MG/ML IJ SOLN: 0.2500 mg | Freq: Once | INTRAMUSCULAR | Status: DC | PRN

## 2017-07-28 NOTE — Progress Notes (Signed)
S: Doing well, no complaints, pain  controlled as not feeling contractions.  S/p cytotec x 4, unable to place cervical foley. Started pitocin 2x2 around noon today, at 30munit/min now.   O: BP 132/60   Pulse 83   Temp 99.2 F (37.3 C) (Oral)   Resp 16   Ht 5\' 6"  (1.676 m)   Wt 104.3 kg (230 lb)   BMI 37.12 kg/m    FHT:  FHR: 120s bpm, variability: moderate,  accelerations:  Present,  decelerations:  Absent UC:   irritibility Cvx: closed, softening, 20% effaced, small dimple of cervix with no 'give'  A / P:  42 y.o.  Obstetric History   G1   P0   T0   P0   A0   L0    SAB0   TAB0   Ectopic0   Multiple0   Live Births0    at [redacted]w[redacted]d IOL for gest htn, very slow progress, not yet in labor. Slow movement of pitocin. d/w nurse plan to cont to increase pitocin per protocol until pt actively contracting. If umable to get into active labor by 10-11pm, willl stop pitocin, allow food, shower and plan cytotec and ambien overnight and will restart pit in am. Pt asks for PCS if unable to get into labor by late afternnon tomorrw. will continue to discuss  Fetal Wellbeing:  Category I Pain Control:  Labor support without medications  Anticipated MOD:  unclear  Ala Dach 07/28/2017, 7:06 PM

## 2017-07-28 NOTE — Progress Notes (Signed)
S; no complaint S/p cytotec x 3  O: BP 121/67   Pulse 71   Temp 98.8 F (37.1 C) (Oral)   Resp 18   Ht 5\' 6"  (1.676 m)   Wt 104.3 kg (230 lb)   BMI 37.12 kg/m  VE closed/60/-3 Attempted intracervical balloon placement w/o success  Tracing: baseline 130 (+) accel to 160 irreg ctx  IMP: gestational HTN Thrombocytopenia P) repeat cytotec. May have light meal

## 2017-07-28 NOTE — Progress Notes (Signed)
S: Doing well, no complaints, pain well controlled as not feeling contractions yet. S/p cytotec x 4, starting pitocin now. No HA, no visoin change, no vaginal bleeding.   O: BP 122/68   Pulse 78   Temp 98.8 F (37.1 C) (Oral)   Resp 16   Ht 5\' 6"  (1.676 m)   Wt 104.3 kg (230 lb)   BMI 37.12 kg/m    FHT:  FHR: 130s bpm, variability: marked,  accelerations:  Present,  decelerations:  Absent UC:   Not tracing well. Unclear if patient contracting yet SVE:   Dilation: 2 Effacement (%): 60 Station: -3 Exam by:: Mabeline Caras, RN   A / P:  42 y.o.  Obstetric History   G1   P0   T0   P0   A0   L0    SAB0   TAB0   Ectopic0   Multiple0   Live Births0    at [redacted]w[redacted]d induction of labor due to gestational hypertension, no evidence of preeclampsia. Slow cervical ripening. Pitocin starting now will AROM when able.  Fetal Wellbeing:  Category I Pain Control:  Labor support without medications  Anticipated MOD:  Guard attempt at vaginal delivery. LGA baby and borderline pelvic shape  Ala Dach 07/28/2017, 2:30 PM

## 2017-07-28 NOTE — Anesthesia Pain Management Evaluation Note (Signed)
  CRNA Pain Management Visit Note  Patient: Bethany Wong, 42 y.o., female  "Hello I am a member of the anesthesia team at [facility]. We have an anesthesia team available at all times to provide care throughout the hospital, including epidural management and anesthesia for C-section. I don't know your plan for the delivery whether it a natural birth, water birth, IV sedation, nitrous supplementation, doula or epidural, but we want to meet your pain goals."   1.Was your pain managed to your expectations on prior hospitalizations?   No prior hospitalizations  2.What is your expectation for pain management during this hospitalization?     Labor support without medications and Epidural  3.How can we help you reach that goal? epidural  Record the patient's initial score and the patient's pain goal.   Pain: 1  Pain Goal: 5 The [facility] wants you to be able to say your pain was always managed very well.  Bethany Wong 07/28/2017

## 2017-07-29 ENCOUNTER — Encounter (HOSPITAL_COMMUNITY)
Admission: AD | Disposition: A | Discharge status: Home / Self Care | Payer: Self-pay | Source: Ambulatory Visit | Attending: Obstetrics

## 2017-07-29 ENCOUNTER — Inpatient Hospital Stay (HOSPITAL_COMMUNITY): Payer: BLUE CROSS/BLUE SHIELD | Admitting: Anesthesiology

## 2017-07-29 ENCOUNTER — Encounter (HOSPITAL_COMMUNITY): Payer: Self-pay | Admitting: *Deleted

## 2017-07-29 LAB — CBC
HEMATOCRIT: 37 % (ref 36.0–46.0)
HEMOGLOBIN: 12.4 g/dL (ref 12.0–15.0)
MCH: 27.7 pg (ref 26.0–34.0)
MCHC: 33.5 g/dL (ref 30.0–36.0)
MCV: 82.6 fL (ref 78.0–100.0)
PLATELETS: 112 10*3/uL — AB (ref 150–400)
RBC: 4.48 MIL/uL (ref 3.87–5.11)
RDW: 14.7 % (ref 11.5–15.5)
WBC: 6.8 10*3/uL (ref 4.0–10.5)

## 2017-07-29 SURGERY — Surgical Case
Anesthesia: Spinal | Site: Abdomen | Wound class: Clean Contaminated

## 2017-07-29 MED ORDER — ACETAMINOPHEN 500 MG PO TABS
1000.0000 mg | ORAL_TABLET | Freq: Four times a day (QID) | ORAL | Status: AC
  Administered 2017-07-30 (×3): 1000 mg via ORAL
  Filled 2017-07-29 (×3): qty 2

## 2017-07-29 MED ORDER — LACTATED RINGERS IV SOLN
INTRAVENOUS | Status: DC
  Administered 2017-07-30: 999 mL via INTRAVENOUS

## 2017-07-29 MED ORDER — NALOXONE HCL 0.4 MG/ML IJ SOLN: 0.4000 mg | INTRAMUSCULAR | Status: DC | PRN

## 2017-07-29 MED ORDER — PRENATAL MULTIVITAMIN CH
1.0000 | ORAL_TABLET | Freq: Every day | ORAL | Status: DC
  Administered 2017-07-30 – 2017-08-01 (×3): 1 via ORAL
  Filled 2017-07-29 (×3): qty 1

## 2017-07-29 MED ORDER — IBUPROFEN 600 MG PO TABS
600.0000 mg | ORAL_TABLET | Freq: Four times a day (QID) | ORAL | Status: DC
  Administered 2017-07-30 – 2017-08-01 (×11): 600 mg via ORAL
  Filled 2017-07-29 (×11): qty 1

## 2017-07-29 MED ORDER — DIBUCAINE 1 % RE OINT: 1.0000 "application " | TOPICAL_OINTMENT | RECTAL | Status: DC | PRN

## 2017-07-29 MED ORDER — SCOPOLAMINE 1 MG/3DAYS TD PT72
MEDICATED_PATCH | TRANSDERMAL | Status: DC | PRN
  Administered 2017-07-29: 1 via TRANSDERMAL

## 2017-07-29 MED ORDER — FENTANYL CITRATE (PF) 100 MCG/2ML IJ SOLN
INTRAMUSCULAR | Status: AC
  Filled 2017-07-29: qty 2

## 2017-07-29 MED ORDER — PROMETHAZINE HCL 25 MG/ML IJ SOLN: 6.2500 mg | INTRAMUSCULAR | Status: DC | PRN

## 2017-07-29 MED ORDER — SIMETHICONE 80 MG PO CHEW: 80.0000 mg | CHEWABLE_TABLET | ORAL | Status: DC | PRN

## 2017-07-29 MED ORDER — SCOPOLAMINE 1 MG/3DAYS TD PT72
1.0000 | MEDICATED_PATCH | Freq: Once | TRANSDERMAL | Status: DC
  Filled 2017-07-29: qty 1

## 2017-07-29 MED ORDER — DIPHENHYDRAMINE HCL 25 MG PO CAPS: 25.0000 mg | ORAL_CAPSULE | Freq: Four times a day (QID) | ORAL | Status: DC | PRN

## 2017-07-29 MED ORDER — NALBUPHINE HCL 10 MG/ML IJ SOLN: 5.0000 mg | Freq: Once | INTRAMUSCULAR | Status: DC | PRN

## 2017-07-29 MED ORDER — ONDANSETRON HCL 4 MG/2ML IJ SOLN: 4.0000 mg | Freq: Three times a day (TID) | INTRAMUSCULAR | Status: DC | PRN

## 2017-07-29 MED ORDER — LACTATED RINGERS IV SOLN
INTRAVENOUS | Status: DC | PRN
  Administered 2017-07-29 (×2): via INTRAVENOUS

## 2017-07-29 MED ORDER — FENTANYL CITRATE (PF) 100 MCG/2ML IJ SOLN: 25.0000 ug | INTRAMUSCULAR | Status: DC | PRN

## 2017-07-29 MED ORDER — MENTHOL 3 MG MT LOZG: 1.0000 | LOZENGE | OROMUCOSAL | Status: DC | PRN

## 2017-07-29 MED ORDER — OXYTOCIN 40 UNITS IN LACTATED RINGERS INFUSION - SIMPLE MED: 2.5000 [IU]/h | INTRAVENOUS | Status: AC

## 2017-07-29 MED ORDER — SIMETHICONE 80 MG PO CHEW
80.0000 mg | CHEWABLE_TABLET | Freq: Three times a day (TID) | ORAL | Status: DC
  Administered 2017-07-30 – 2017-07-31 (×5): 80 mg via ORAL
  Filled 2017-07-29 (×7): qty 1

## 2017-07-29 MED ORDER — PHENYLEPHRINE HCL 10 MG/ML IJ SOLN
INTRAMUSCULAR | Status: DC | PRN
Start: 2017-07-29 — End: 2017-07-29
  Administered 2017-07-29: 80 ug via INTRAVENOUS

## 2017-07-29 MED ORDER — COCONUT OIL OIL
1.0000 "application " | TOPICAL_OIL | Status: DC | PRN
  Administered 2017-07-31: 1 via TOPICAL
  Filled 2017-07-29: qty 120

## 2017-07-29 MED ORDER — MORPHINE SULFATE (PF) 0.5 MG/ML IJ SOLN
INTRAMUSCULAR | Status: AC
  Filled 2017-07-29: qty 10

## 2017-07-29 MED ORDER — MIDAZOLAM HCL 2 MG/2ML IJ SOLN
INTRAMUSCULAR | Status: AC
  Filled 2017-07-29: qty 2

## 2017-07-29 MED ORDER — TETANUS-DIPHTH-ACELL PERTUSSIS 5-2.5-18.5 LF-MCG/0.5 IM SUSP: 0.5000 mL | Freq: Once | INTRAMUSCULAR | Status: DC

## 2017-07-29 MED ORDER — ONDANSETRON HCL 4 MG/2ML IJ SOLN
INTRAMUSCULAR | Status: DC | PRN
  Administered 2017-07-29: 4 mg via INTRAVENOUS

## 2017-07-29 MED ORDER — DIPHENHYDRAMINE HCL 50 MG/ML IJ SOLN: 12.5000 mg | INTRAMUSCULAR | Status: DC | PRN

## 2017-07-29 MED ORDER — ZOLPIDEM TARTRATE 5 MG PO TABS: 5.0000 mg | ORAL_TABLET | Freq: Every evening | ORAL | Status: DC | PRN

## 2017-07-29 MED ORDER — SENNOSIDES-DOCUSATE SODIUM 8.6-50 MG PO TABS
2.0000 | ORAL_TABLET | ORAL | Status: DC
  Administered 2017-07-30 – 2017-07-31 (×3): 2 via ORAL
  Filled 2017-07-29 (×3): qty 2

## 2017-07-29 MED ORDER — ACETAMINOPHEN 325 MG PO TABS
650.0000 mg | ORAL_TABLET | ORAL | Status: DC | PRN
  Administered 2017-07-31: 650 mg via ORAL
  Filled 2017-07-29: qty 2

## 2017-07-29 MED ORDER — CEFAZOLIN SODIUM-DEXTROSE 2-3 GM-%(50ML) IV SOLR
INTRAVENOUS | Status: DC | PRN
  Administered 2017-07-29: 2 g via INTRAVENOUS

## 2017-07-29 MED ORDER — FENTANYL CITRATE (PF) 100 MCG/2ML IJ SOLN
INTRAMUSCULAR | Status: DC | PRN
  Administered 2017-07-29: 90 ug via INTRAVENOUS
  Administered 2017-07-29: 10 ug via INTRATHECAL

## 2017-07-29 MED ORDER — DEXAMETHASONE SODIUM PHOSPHATE 10 MG/ML IJ SOLN
INTRAMUSCULAR | Status: DC | PRN
  Administered 2017-07-29: 10 mg via INTRAVENOUS

## 2017-07-29 MED ORDER — OXYTOCIN 10 UNIT/ML IJ SOLN
INTRAVENOUS | Status: DC | PRN
  Administered 2017-07-29: 40 [IU] via INTRAVENOUS

## 2017-07-29 MED ORDER — SIMETHICONE 80 MG PO CHEW
80.0000 mg | CHEWABLE_TABLET | ORAL | Status: DC
  Administered 2017-07-30 – 2017-07-31 (×3): 80 mg via ORAL
  Filled 2017-07-29 (×3): qty 1

## 2017-07-29 MED ORDER — DIPHENHYDRAMINE HCL 25 MG PO CAPS: 25.0000 mg | ORAL_CAPSULE | ORAL | Status: DC | PRN

## 2017-07-29 MED ORDER — MIDAZOLAM HCL 2 MG/2ML IJ SOLN
INTRAMUSCULAR | Status: DC | PRN
Start: 2017-07-29 — End: 2017-07-29
  Administered 2017-07-29: 2 mg via INTRAVENOUS

## 2017-07-29 MED ORDER — SODIUM CHLORIDE 0.9% FLUSH: 3.0000 mL | INTRAVENOUS | Status: DC | PRN

## 2017-07-29 MED ORDER — WITCH HAZEL-GLYCERIN EX PADS: 1.0000 "application " | MEDICATED_PAD | CUTANEOUS | Status: DC | PRN

## 2017-07-29 MED ORDER — KETAMINE HCL 10 MG/ML IJ SOLN
INTRAMUSCULAR | Status: DC | PRN
  Administered 2017-07-29 (×3): 10 mg via INTRAVENOUS
  Administered 2017-07-29: 30 mg via INTRAVENOUS
  Administered 2017-07-29: 10 mg via INTRAVENOUS
  Administered 2017-07-29: 20 mg via INTRAVENOUS

## 2017-07-29 MED ORDER — MORPHINE SULFATE (PF) 0.5 MG/ML IJ SOLN
INTRAMUSCULAR | Status: DC | PRN
  Administered 2017-07-29: .5 mg via EPIDURAL
  Administered 2017-07-29: 1 mg via EPIDURAL
  Administered 2017-07-29: .2 mg via INTRATHECAL
  Administered 2017-07-29: 1 mg via EPIDURAL
  Administered 2017-07-29 (×2): .5 mg via EPIDURAL

## 2017-07-29 MED ORDER — NALOXONE HCL 4 MG/10ML IJ SOLN
1.0000 ug/kg/h | INTRAVENOUS | Status: DC | PRN
  Filled 2017-07-29: qty 5

## 2017-07-29 MED ORDER — KETAMINE HCL 10 MG/ML IJ SOLN
INTRAMUSCULAR | Status: AC
  Filled 2017-07-29: qty 1

## 2017-07-29 MED ORDER — NALBUPHINE HCL 10 MG/ML IJ SOLN: 5.0000 mg | INTRAMUSCULAR | Status: DC | PRN

## 2017-07-29 MED ORDER — BUPIVACAINE IN DEXTROSE 0.75-8.25 % IT SOLN
INTRATHECAL | Status: DC | PRN
  Administered 2017-07-29: 1.6 mL via INTRATHECAL

## 2017-07-29 MED ORDER — PROPOFOL 10 MG/ML IV BOLUS
INTRAVENOUS | Status: DC | PRN
  Administered 2017-07-29 (×3): 10 mg via INTRAVENOUS

## 2017-07-29 SURGICAL SUPPLY — 36 items
BENZOIN TINCTURE PRP APPL 2/3 (GAUZE/BANDAGES/DRESSINGS) ×3 IMPLANT
CHLORAPREP W/TINT 26ML (MISCELLANEOUS) ×3 IMPLANT
CLAMP CORD UMBIL (MISCELLANEOUS) IMPLANT
CLOSURE STERI STRIP 1/2 X4 (GAUZE/BANDAGES/DRESSINGS) ×3 IMPLANT
CLOSURE WOUND 1/2 X4 (GAUZE/BANDAGES/DRESSINGS)
CLOTH BEACON ORANGE TIMEOUT ST (SAFETY) ×3 IMPLANT
DRSG OPSITE POSTOP 4X10 (GAUZE/BANDAGES/DRESSINGS) ×3 IMPLANT
ELECT REM PT RETURN 9FT ADLT (ELECTROSURGICAL) ×3
ELECTRODE REM PT RTRN 9FT ADLT (ELECTROSURGICAL) ×1 IMPLANT
EXTRACTOR VACUUM M CUP 4 TUBE (SUCTIONS) ×2 IMPLANT
EXTRACTOR VACUUM M CUP 4' TUBE (SUCTIONS) ×1
GLOVE BIO SURGEON STRL SZ 6.5 (GLOVE) ×2 IMPLANT
GLOVE BIO SURGEONS STRL SZ 6.5 (GLOVE) ×1
GLOVE BIOGEL PI IND STRL 7.0 (GLOVE) ×2 IMPLANT
GLOVE BIOGEL PI INDICATOR 7.0 (GLOVE) ×4
GOWN STRL REUS W/TWL LRG LVL3 (GOWN DISPOSABLE) ×6 IMPLANT
KIT ABG SYR 3ML LUER SLIP (SYRINGE) IMPLANT
NEEDLE HYPO 22GX1.5 SAFETY (NEEDLE) IMPLANT
NEEDLE HYPO 25X5/8 SAFETYGLIDE (NEEDLE) IMPLANT
NS IRRIG 1000ML POUR BTL (IV SOLUTION) ×3 IMPLANT
PACK C SECTION WH (CUSTOM PROCEDURE TRAY) ×3 IMPLANT
PAD OB MATERNITY 4.3X12.25 (PERSONAL CARE ITEMS) ×3 IMPLANT
PENCIL SMOKE EVAC W/HOLSTER (ELECTROSURGICAL) ×3 IMPLANT
STRIP CLOSURE SKIN 1/2X4 (GAUZE/BANDAGES/DRESSINGS) IMPLANT
SUT MON AB 4-0 PS1 27 (SUTURE) ×3 IMPLANT
SUT PLAIN 0 NONE (SUTURE) IMPLANT
SUT PLAIN 2 0 XLH (SUTURE) ×3 IMPLANT
SUT VIC AB 0 CT1 36 (SUTURE) ×6 IMPLANT
SUT VIC AB 0 CTX 36 (SUTURE) ×6
SUT VIC AB 0 CTX36XBRD ANBCTRL (SUTURE) ×3 IMPLANT
SUT VIC AB 2-0 CT1 27 (SUTURE) ×2
SUT VIC AB 2-0 CT1 TAPERPNT 27 (SUTURE) ×1 IMPLANT
SUT VIC AB 4-0 KS 27 (SUTURE) ×3 IMPLANT
SYR CONTROL 10ML LL (SYRINGE) IMPLANT
TOWEL OR 17X24 6PK STRL BLUE (TOWEL DISPOSABLE) ×3 IMPLANT
TRAY FOLEY BAG SILVER LF 14FR (SET/KITS/TRAYS/PACK) IMPLANT

## 2017-07-29 NOTE — Progress Notes (Signed)
S: HD#3, IOL for gest htn.  S/p cytotec x 4, pitocin x 11 hrs (up to 20 munits). cytotec x 2 last night. Pitocin all day today.   Pt notes no ctx, but some cramping. good FM, no LOF, no VB. No HA, occ RUQ pain from fetal movement, no scotomata.   O: BP 137/71   Pulse 79   Temp 98.1 F (36.7 C) (Oral)   Resp 18   Ht 5\' 6"  (1.676 m)   Wt 104.3 kg (230 lb)   BMI 37.12 kg/m    Vitals:   07/29/17 1438 07/29/17 1510 07/29/17 1514 07/29/17 1549  BP: (!) 150/94  (!) 128/93 137/71  Pulse: 83  89 79  Resp: 16 20 20 18   Temp:    98.1 F (36.7 C)  TempSrc:    Oral  Weight:      Height:          FHT:  FHR: 140s bpm, variability: moderate,  accelerations:  Present,  decelerations:  Absent UC:   none SVE:   Dilation: Closed Effacement (%): Thick Station: -3 Exam by:: s [id number] RN  Repeat exam by me now: no change  A / P:  42 y.o.  Obstetric History   G1   P0   T0   P0   A0   L0    SAB0   TAB0   Ectopic0   Multiple0   Live Births0    at [redacted]w[redacted]d IOL, gest htn, unable to achieve labor, will proceed with PCS.   getst htn, stable, no meds, no evdiecne PEC getational thrombocytopenia  Fetal Wellbeing:  Category I Pain Control:  Labor support without medications  Anticipated MOD:  PCS  R./B reviewed with pt.   Ala Dach 07/29/2017, 4:01 PM

## 2017-07-29 NOTE — Transfer of Care (Signed)
Immediate Anesthesia Transfer of Care Note  Patient: Bethany Wong  Procedure(s) Performed: CESAREAN SECTION (N/A Abdomen)  Patient Location: PACU  Anesthesia Type:Spinal  Level of Consciousness: awake, alert  and oriented  Airway & Oxygen Therapy: Patient Spontanous Breathing  Post-op Assessment: Report given to RN and Post -op Vital signs reviewed and stable  Post vital signs: Reviewed and stable  Last Vitals:  Vitals:   07/29/17 1549 07/29/17 1800  BP: 137/71   Pulse: 79 (P) 97  Resp: 18 (!) (P) 26  Temp: 36.7 C (P) 36.8 C  SpO2:  (P) 99%    Last Pain:  Vitals:   07/29/17 1800  TempSrc: (P) Oral  PainSc:          Complications: No apparent anesthesia complications

## 2017-07-29 NOTE — Op Note (Signed)
07/27/2017 - 07/29/2017  5:40 PM  PATIENT:  Jacklyn Shell  42 y.o. female  PRE-OPERATIVE DIAGNOSIS:  failed induction of labor, gestational thrombocytopenia, gestationalhypertension, pelvic adhesions and uterine fibroids  POST-OPERATIVE DIAGNOSIS:  failed induction of labor, gestational thrombocytopenia and hypertension, pelvic adhesions and uterine fibroids  PROCEDURE:  Procedure(s): CESAREAN SECTION (N/A)  Primary low-transverse cesarean section with 2 layer closure  SURGEON:  Surgeon(s) and Role:    * Aloha Gell, MD - Primary  PHYSICIAN ASSISTANT:   ASSISTANTS: Derrell Lolling, CNM   ANESTHESIA:   spinal  EBL:  872 mL   BLOOD ADMINISTERED:none  DRAINS: Urinary Catheter (Foley)   LOCAL MEDICATIONS USED:  NONE  SPECIMEN:  Source of Specimen:  placenta  DISPOSITION OF SPECIMEN:  labor and delivery  COUNTS:  YES  TOURNIQUET:  * No tourniquets in log *  DICTATION: .Note written in EPIC  PLAN OF CARE: Admit to inpatient   PATIENT DISPOSITION:  PACU - hemodynamically stable.   Delay start of Pharmacological VTE agent (>24hrs) due to surgical blood loss or risk of bleeding: yes     Findings:  @BABYSEXEBC @ infant,  APGAR (1 MIN):   APGAR (5 MINS):   APGAR (10 MINS):   Normal  normal placenta. 3VC, clear amniotic fluid, female infant, OA position, Apgars per baby chart. Uterus with multiple exophytic calcified fibroids with significant adhesions to the bowel wall and bilateral pelvic sidewall. Unable to visualize tubes and ovaries  EBL: 850 cc Antibiotics:   2g Ancef Complications: none  Indications: This is a 42 y.o. year-old, G1  At [redacted]w[redacted]d admitted for induction of labor due to gestational hypertension without evidence of preeclampsia. Patient had 4 doses of Cytotec, 11 hours of Pitocin, 2 additional doses of Cytotec and additional 8 hours of Pitocin without ever feeling contractions or having any cervical change. Patient was given the option of continued attempt  at induction of labor versus primary cesarean section and opted for the latter. Risks benefits and alternatives of the procedure were discussed with the patient who agreed to proceed  Procedure:  After informed consent was obtained the patient was taken to the operating room where spinal anesthesia was initiated.  She was prepped and draped in the normal sterile fashion in dorsal supine position with a leftward tilt.  A foley catheter was in place.  A Pfannenstiel skin incision was made 2 cm above the pubic symphysis in the midline with the scalpel.  Dissection was carried down with the Bovie cautery until the fascia was reached. The fascia was incised in the midline. The incision was extended laterally with the Mayo scissors. The inferior aspect of the fascial incision was grasped with the Coker clamps, elevated up and the underlying rectus muscles were dissected off sharply. The superior aspect of the fascial incision was grasped with the Coker clamps elevated up and the underlying rectus muscles were dissected off sharply.  The peritoneum was entered sharply. The peritoneal incision was extended superiorly and inferiorly with good visualization of the bladder. The bladder blade was inserted and palpation was done to assess the fetal position and the location of the uterine vessels. The lower segment of the uterus was incised sharply with the scalpel and extended  bluntly in the cephalo-caudal fashion. The pelvis was noted to be quite narrow. Attempt to grasp the infant's vertex and bring to the incision was unsuccessful due to inadequate space. A manual vacuum was placed on the fetal occiput and after one pop off due to initial port  placement and excessive fetal hair, the vertex was guided out of the pelvis.The infant was ,  rotated and the infant was delivered with fundal pressure. The nose and mouth were bulb suctioned. The cord was clamped and cut after 1 minute delay. The infant was handed off to the  waiting pediatrician. The placenta was expressed. The uterus wasunable to be exteriorized.palpation revealed several 5 cm exophytic fibroids with adhesions to the lateral side walls and the intestines. The uterus was left in situ The uterus was cleared of all clots and debris. The uterine incision was repaired with 0 Vicryl in a running locked fashion.  A second layer of the same suture was used in an imbricating fashion to obtain excellent hemostasis. Several additional sutures were placed at the left angle of the incision for hemostasis.The uterine incision was reinspected and found to be hemostatic. The peritoneum was grasped and closed with 2-0 Vicryl in a running fashion. The cut muscle edges and the underside of the fascia were inspected and found to be hemostatic. The fascia was closed with 0 Vicryl in 2 layers.  Scarpa's layer was closed with a 2-0 plain gut suture. The skin was closed with a 4-0 Monocryl in a single layer. The patient tolerated the procedure well. Sponge lap and needle counts were correct x3 and patient was taken to the recovery room in a stable condition.  Ala Dach 07/29/2017 5:41 PM

## 2017-07-29 NOTE — Anesthesia Procedure Notes (Signed)
Spinal  Patient location during procedure: OR Start time: 07/29/2017 4:36 PM End time: 07/29/2017 4:38 PM Staffing Anesthesiologist: Catalina Gravel, MD Performed: anesthesiologist  Preanesthetic Checklist Completed: patient identified, surgical consent, pre-op evaluation, timeout performed, IV checked, risks and benefits discussed and monitors and equipment checked Spinal Block Patient position: sitting Prep: site prepped and draped and DuraPrep Patient monitoring: continuous pulse ox and blood pressure Approach: midline Location: L3-4 Injection technique: single-shot Needle Needle type: Pencan  Needle gauge: 24 G Assessment Sensory level: T4 Additional Notes Functioning IV was confirmed and monitors were applied. Sterile prep and drape, including hand hygiene, mask and sterile gloves were used. The patient was positioned and the spine was prepped. The skin was anesthetized with lidocaine.  Free flow of clear CSF was obtained prior to injecting local anesthetic into the CSF.  The spinal needle aspirated freely following injection.  The needle was carefully withdrawn.  The patient tolerated the procedure well. Consent was obtained prior to procedure with all questions answered and concerns addressed. Risks including but not limited to bleeding, infection, nerve damage, paralysis, failed block, inadequate analgesia, allergic reaction, high spinal, itching and headache were discussed and the patient wished to proceed.   Hoy Morn, MD

## 2017-07-29 NOTE — Progress Notes (Signed)
Lab work drawn.

## 2017-07-29 NOTE — Progress Notes (Signed)
S: HD#3, IOL for gest htn.  S/p cytotec x 4, pitocin x 11 hrs (up to 20 munits). cytotec x 2 last night.  Pt notes no ctx, good FM, no LOF, no VB. No HA, occ RUQ pain from fetal movement, no scotomata.   O: BP (!) 148/88   Pulse (!) 120   Temp 97.9 F (36.6 C) (Oral)   Resp 18   Ht 5\' 6"  (1.676 m)   Wt 104.3 kg (230 lb)   BMI 37.12 kg/m    Vitals:   07/29/17 0255 07/29/17 0403 07/29/17 0615 07/29/17 0808  BP: 124/62 (!) 151/77 (!) 129/92 (!) 148/88  Pulse: 70 68 88 (!) 120  Resp: 16 16  18   Temp: 99.2 F (37.3 C) 98 F (36.7 C)  97.9 F (36.6 C)  TempSrc: Oral Oral  Oral  Weight:      Height:          FHT:  FHR: 140s bpm, variability: moderate,  accelerations:  Present,  decelerations:  Absent UC:   none SVE:   Dilation: Closed Effacement (%): Thick Station: -3 Exam by:: [employer], RN    A / P:  42 y.o.  Obstetric History   G1   P0   T0   P0   A0   L0    SAB0   TAB0   Ectopic0   Multiple0   Live Births0    at [redacted]w[redacted]d IOL, gest htn, very slow progress, unable to place cervical foley, not responding to cytotec or pitociin. Repeat pitocin today, if unable to get into active labor may consider PCS  getst htn, stable, no meds, no evdiecne PEC, nl labs last night.   Fetal Wellbeing:  Category I Pain Control:  Labor support without medications  Anticipated MOD:  unclear  Ala Dach 07/29/2017, 8:44 AM

## 2017-07-29 NOTE — Anesthesia Preprocedure Evaluation (Signed)
Anesthesia Evaluation  Patient identified by MRN, date of birth, ID band Patient awake    Reviewed: Allergy & Precautions, NPO status , Patient's Chart, lab work & pertinent test results  Airway Mallampati: II  TM Distance: >3 FB Neck ROM: Full    Dental  (+) Teeth Intact, Dental Advisory Given   Pulmonary neg pulmonary ROS,    Pulmonary exam normal breath sounds clear to auscultation       Cardiovascular hypertension (PIH), Normal cardiovascular exam Rhythm:Regular Rate:Normal     Neuro/Psych negative neurological ROS  negative psych ROS   GI/Hepatic negative GI ROS, Neg liver ROS,   Endo/Other  negative endocrine ROSObesity   Renal/GU negative Renal ROS     Musculoskeletal negative musculoskeletal ROS (+)   Abdominal   Peds  Hematology  (+) Blood dyscrasia (Plt 112k), anemia ,   Anesthesia Other Findings Day of surgery medications reviewed with the patient.  Reproductive/Obstetrics (+) Pregnancy                             Anesthesia Physical Anesthesia Plan  ASA: III  Anesthesia Plan: Spinal   Post-op Pain Management:    Induction:   PONV Risk Score and Plan: 2 and Dexamethasone, Ondansetron, Scopolamine patch - Pre-op and Treatment may vary due to age or medical condition  Airway Management Planned:   Additional Equipment:   Intra-op Plan:   Post-operative Plan:   Informed Consent: I have reviewed the patients History and Physical, chart, labs and discussed the procedure including the risks, benefits and alternatives for the proposed anesthesia with the patient or authorized representative who has indicated his/her understanding and acceptance.   Dental advisory given  Plan Discussed with: CRNA, Anesthesiologist and Surgeon  Anesthesia Plan Comments: (Failed IOL.  PIH, Plt 112k. Will proceed with spinal for primary C-section.)        Anesthesia Quick  Evaluation

## 2017-07-29 NOTE — Brief Op Note (Signed)
07/27/2017 - 07/29/2017  5:40 PM  PATIENT:  Bethany Wong  42 y.o. female  PRE-OPERATIVE DIAGNOSIS:  failed induction of labor, gestational thrombocytopenia, gestationalhypertension, pelvic adhesions and uterine fibroids  POST-OPERATIVE DIAGNOSIS:  failed induction of labor, gestational thrombocytopenia and hypertension, pelvic adhesions and uterine fibroids  PROCEDURE:  Procedure(s): CESAREAN SECTION (N/A)  Primary low-transverse cesarean section with 2 layer closure  SURGEON:  Surgeon(s) and Role:    * Aloha Gell, MD - Primary  PHYSICIAN ASSISTANT:   ASSISTANTS: Derrell Lolling, CNM   ANESTHESIA:   spinal  EBL:  872 mL   BLOOD ADMINISTERED:none  DRAINS: Urinary Catheter (Foley)   LOCAL MEDICATIONS USED:  NONE  SPECIMEN:  Source of Specimen:  placenta  DISPOSITION OF SPECIMEN:  labor and delivery  COUNTS:  YES  TOURNIQUET:  * No tourniquets in log *  DICTATION: .Note written in EPIC  PLAN OF CARE: Admit to inpatient   PATIENT DISPOSITION:  PACU - hemodynamically stable.   Delay start of Pharmacological VTE agent (>24hrs) due to surgical blood loss or risk of bleeding: yes

## 2017-07-29 NOTE — Progress Notes (Signed)
Eating light laboring diet

## 2017-07-29 NOTE — Anesthesia Postprocedure Evaluation (Signed)
Anesthesia Post Note  Patient: Bethany Wong  Procedure(s) Performed: CESAREAN SECTION (N/A Abdomen)     Patient location during evaluation: PACU Anesthesia Type: Spinal Level of consciousness: oriented, awake and alert, awake and patient cooperative Pain management: pain level controlled Vital Signs Assessment: post-procedure vital signs reviewed and stable Respiratory status: spontaneous breathing, respiratory function stable and nonlabored ventilation Cardiovascular status: blood pressure returned to baseline and stable Postop Assessment: no headache, no backache, no apparent nausea or vomiting, spinal receding and patient able to bend at knees Anesthetic complications: no    Last Vitals:  Vitals:   07/29/17 1845 07/29/17 1900  BP: 138/81 138/82  Pulse: 63 61  Resp: 16 17  Temp:    SpO2: 100% 97%    Last Pain:  Vitals:   07/29/17 1900  TempSrc:   PainSc: 0-No pain   Pain Goal:                 Catalina Gravel

## 2017-07-30 ENCOUNTER — Encounter (HOSPITAL_COMMUNITY): Payer: Self-pay | Admitting: Obstetrics

## 2017-07-30 DIAGNOSIS — D696 Thrombocytopenia, unspecified: Secondary | ICD-10-CM | POA: Diagnosis present

## 2017-07-30 LAB — CBC
HCT: 32.5 % — ABNORMAL LOW (ref 36.0–46.0)
HEMOGLOBIN: 11 g/dL — AB (ref 12.0–15.0)
MCH: 28.4 pg (ref 26.0–34.0)
MCHC: 33.8 g/dL (ref 30.0–36.0)
MCV: 84 fL (ref 78.0–100.0)
PLATELETS: 123 10*3/uL — AB (ref 150–400)
RBC: 3.87 MIL/uL (ref 3.87–5.11)
RDW: 15.1 % (ref 11.5–15.5)
WBC: 11.9 10*3/uL — ABNORMAL HIGH (ref 4.0–10.5)

## 2017-07-30 LAB — GLUCOSE, CAPILLARY: GLUCOSE-CAPILLARY: 120 mg/dL — AB (ref 65–99)

## 2017-07-30 MED ORDER — LACTATED RINGERS IV BOLUS (SEPSIS)
1000.0000 mL | Freq: Once | INTRAVENOUS | Status: AC
  Administered 2017-07-30: 1000 mL via INTRAVENOUS

## 2017-07-30 NOTE — Progress Notes (Signed)
Patient up and ambulated to bathroom at 4-hr check.  Peri/foley care.  Patient wanted to go see baby in NICU (for first time). After helping mom handexpress 1 ml colostrum to give baby, this  nurse brought mom up to NICU in wheelchair.

## 2017-07-30 NOTE — Progress Notes (Signed)
Subjective: POD# 1 Information for the patient's newborn:  Raeven, Pint [[id number]]  female  Baby name: Ma-Kiah NICU admit for respiratory distress, stable  Reports feeling well Feeding: breast Patient reports tolerating PO.  Breast symptoms: pumping Pain controlled with PO meds Denies HA/SOB/C/P/N/V/dizziness. Flatus absent. She reports vaginal bleeding as normal, without clots.  Has not been out of bed yet, foley to gravity. Objective:   VS:    Vitals:   07/29/17 2145 07/29/17 2300 07/30/17 0355 07/30/17 0900  BP: 132/80  123/73 140/78  Pulse: (!) 59  61 62  Resp: 18 18 18 16   Temp: 97.8 F (36.6 C) 97.6 F (36.4 C) 97.7 F (36.5 C) 98.2 F (36.8 C)  TempSrc:   Oral Oral  SpO2:  100%  100%  Weight:      Height:          Intake/Output Summary (Last 24 hours) at 07/30/2017 1017 Last data filed at 07/30/2017 0900 Gross per 24 hour  Intake 4793.33 ml  Output 1977 ml  Net 2816.33 ml      CBC Latest Ref Rng & Units 07/30/2017 07/29/2017 07/28/2017  WBC 4.0 - 10.5 K/uL 11.9(H) 6.8 8.2  Hemoglobin 12.0 - 15.0 g/dL 11.0(L) 12.4 12.3  Hematocrit 36.0 - 46.0 % 32.5(L) 37.0 37.1  Platelets 150 - 400 K/uL 123(L) 112(L) 111(L)   CMP Latest Ref Rng & Units 07/28/2017 07/27/2017 05/25/2015  Glucose 65 - 99 mg/dL 88 107(H) 130(H)  BUN 6 - 20 mg/dL 8 8 9   Creatinine 0.44 - 1.00 mg/dL 0.66 0.59 0.99  Sodium 135 - 145 mmol/L 134(L) 136 139  Potassium 3.5 - 5.1 mmol/L 4.0 3.7 3.7  Chloride 101 - 111 mmol/L 105 108 106  CO2 22 - 32 mmol/L 22 20(L) 24  Calcium 8.9 - 10.3 mg/dL 9.0 8.7(L) 8.7(L)  Total Protein 6.5 - 8.1 g/dL 5.8(L) 5.5(L) 6.8  Total Bilirubin 0.3 - 1.2 mg/dL 0.7 0.5 0.5  Alkaline Phos 38 - 126 U/L 153(H) 136(H) 67  AST 15 - 41 U/L 25 27 31   ALT 14 - 54 U/L 17 15 25     Blood type: --/--/O POS, O POS  Rubella: Immune (08/17 0000)     Physical Exam:  General: alert, cooperative and no distress CV: Regular rate and rhythm Resp: clear Abdomen: soft, nontender,  normal bowel sounds Incision: clean, dry and intact Uterine Fundus: firm, below umbilicus, nontender Lochia: minimal Ext: no edema, redness or tenderness in the calves or thighs      Assessment/Plan: 42 y.o.   POD# 1. G1P1001                  Principal Problem:   Cesarean delivery delivered- Indication: failed IOL 3/2 Active Problems:   PIH (pregnancy induced hypertension)   Postpartum care following cesarean delivery   Thrombocytopenia ([place])  - asymptomatic, plts improving  Doing well, stable.    Normotensive mostly and no neural s/sx         Advance diet as tolerated DC foley cath and may ambulate to NICU if stable gait Breastfeeding support Routine post-op care  Juliene Pina, CNM, MSN 07/30/2017, 10:17 AM

## 2017-07-30 NOTE — Progress Notes (Signed)
Encouraged patient to ambulate in room and hallway.  Patient stated she will do so once her visitors leave.  Educated on the importance of ambulation after cesarean delivery.  Will continue to monitor and encourage.

## 2017-07-30 NOTE — Progress Notes (Signed)
Set mom up with debp.  Showed how to use and clean, with teachback. Encouraged to pump every 3 hours.

## 2017-07-30 NOTE — Anesthesia Postprocedure Evaluation (Signed)
Anesthesia Post Note  Patient: Bethany Wong  Procedure(s) Performed: CESAREAN SECTION (N/A Abdomen)     Patient location during evaluation: Mother Baby Anesthesia Type: Spinal Level of consciousness: awake and alert Pain management: pain level controlled Vital Signs Assessment: post-procedure vital signs reviewed and stable Respiratory status: spontaneous breathing, nonlabored ventilation and respiratory function stable Cardiovascular status: stable Postop Assessment: no headache, no backache, spinal receding, adequate PO intake, no apparent nausea or vomiting and patient able to bend at knees Anesthetic complications: no    Last Vitals:  Vitals:   07/29/17 2300 07/30/17 0355  BP:  123/73  Pulse:  61  Resp: 18 18  Temp: 36.4 C 36.5 C  SpO2: 100%     Last Pain:  Vitals:   07/30/17 0650  TempSrc:   PainSc: 0-No pain   Pain Goal:                 [employer]

## 2017-07-30 NOTE — Progress Notes (Signed)
Patient off unit to NICU.  Transported by NT via wheelchair.

## 2017-07-30 NOTE — Addendum Note (Signed)
Addendum  created 07/30/17 0742 by Hewitt Blade, CRNA   Sign clinical note

## 2017-07-31 NOTE — Lactation Note (Signed)
This note was copied from a baby's chart. Lactation Consultation Note  Patient Name: Bethany Wong JIRCV'E Date: 07/31/2017 Reason for consult: Initial assessment  P1, Baby 60 hours old. Baby was in NICU and now is back in room with mother. Mother states since she received bottles she has not been latching as well. Mother states she has hand expressed drops and allowed baby to suckle on her finger before latching. Suggest mother call for assistance w/ latching. In order to protect her milk supply suggest mother pump q2.5-3 hours. Mom made aware of O/P services, breastfeeding support groups, community resources, and our phone # for post-discharge questions.     Maternal Data Has patient been taught Hand Expression?: Yes Does the patient have breastfeeding experience prior to this delivery?: No  Feeding Feeding Type: Breast Fed Length of feed: 0 min  LATCH Score                   Interventions Interventions: Hand express  Lactation Tools Discussed/Used     Consult Status Consult Status: Follow-up Date: 08/01/17 Follow-up type: In-patient    [name] [address] [employer] 07/31/2017, 1:34 PM

## 2017-07-31 NOTE — Progress Notes (Signed)
Subjective: POD# 2 Information for the patient's newborn:  Bethany, Wong [[id number]]  female  Baby name: Ma'Khia  Reports feeling well.  Feeding: breast and bottle, baby was started on formula in NICU Patient reports tolerating PO.  Breast symptoms: + colostrum Pain controlled with PO meds Denies HA/SOB/C/P/N/V/dizziness. Flatus present. She reports vaginal bleeding as normal, without clots.  She is ambulating, urinating without difficulty.     Objective:   VS:    Vitals:   07/30/17 0900 07/30/17 1305 07/30/17 1630 07/31/17 0521  BP: 140/78 132/79 132/82 137/89  Pulse: 62 70 72 65  Resp: 16 16 17 17   Temp: 98.2 F (36.8 C) 98.2 F (36.8 C) 98.7 F (37.1 C) 97.7 F (36.5 C)  TempSrc: Oral Oral Oral   SpO2: 100% 100% 100% 100%  Weight:      Height:          Intake/Output Summary (Last 24 hours) at 07/31/2017 0910 Last data filed at 07/30/2017 1932 Gross per 24 hour  Intake 360 ml  Output 1000 ml  Net -640 ml        Recent Labs    07/29/17 1505 07/30/17 0549  WBC 6.8 11.9*  HGB 12.4 11.0*  HCT 37.0 32.5*  PLT 112* 123*     Blood type: --/--/O POS, O POS Performed at [facility], [address], [place], [place] [address]  (02/28 1717)  Rubella: Immune (08/17 0000)     Physical Exam:  General: alert, cooperative and no distress CV: Regular rate and rhythm Resp: clear Abdomen: soft, nontender, normal bowel sounds Incision: dry, intact and serous and minimal drainage present Uterine Fundus: firm, below umbilicus, nontender Lochia: minimal Ext: edema +1 pedal, no cords or calf tenderness      Assessment/Plan: 42 y.o.   POD# 2. G1P1001                  Principal Problem:   Cesarean delivery delivered- Indication: failed IOL 3/2 Active Problems:   PIH (pregnancy induced hypertension)  - normotensive overall, no PEC s/s   Postpartum care following cesarean delivery   Thrombocytopenia ([place])  - asymptomatic, improved platelets  Doing  well, stable.    Routine post-op care LC support today to resume exclusive breastfeeding Anticipate DC in [place], CNM, MSN 07/31/2017, 9:10 AM

## 2017-08-01 MED ORDER — ACETAMINOPHEN 325 MG PO TABS: 650.0000 mg | ORAL_TABLET | ORAL | 0 refills | Status: AC | PRN

## 2017-08-01 MED ORDER — IBUPROFEN 600 MG PO TABS: 600.0000 mg | ORAL_TABLET | Freq: Four times a day (QID) | ORAL | 0 refills | Status: AC

## 2017-08-01 NOTE — Progress Notes (Signed)
POSTOPERATIVE DAY # 3 S/P Primary LTCS for failed IOL, gestational hypertension, baby girl "Ma'Khia"  S:         Reports feeling much better and is ready to go home today              Tolerating po intake / no nausea / no vomiting / + flatus / + BM  Denies HA, scotoma, RUQ/epigastric pain   Denies dizziness, SOB, or CP             Bleeding is light             Pain controlled with Motrin and Tylenol             Up ad lib / ambulatory/ voiding QS  Newborn breast feeding/pumping colostrum with formula supplementation    O:  VS: BP 137/79 (BP Location: Right Arm)   Pulse 79   Temp 98.5 F (36.9 C) (Oral)   Resp 18   Ht 5\' 6"  (1.676 m)   Wt 104.3 kg (230 lb)   SpO2 100%   Breastfeeding? Unknown   BMI 37.12 kg/m    LABS:               Recent Labs    07/29/17 1505 07/30/17 0549  WBC 6.8 11.9*  HGB 12.4 11.0*  PLT 112* 123*               Bloodtype: --/--/O POS, O POS Performed at [facility], [address], [place], [place] [address]  (02/28 1717)  Rubella: Immune (08/17 0000)                                   Physical Exam:             Alert and Oriented X3  Lungs: Clear and unlabored  Heart: regular rate and rhythm / no murmurs  Abdomen: soft, non-tender, non-distended, active bowel sounds sounds              Fundus: firm, non-tender, U-2             Dressing: honeycomb dressing with steri-strips; c/d/i              Incision:  approximated with sutures / no erythema / no ecchymosis / no drainage  Perineum: intact  Lochia: small, no clots   Extremities: +2 BLE edema, no calf pain or tenderness; +2DTRs; no clonus bilaterally  A/P:      POD # 3 S/P Primary LTCS for failed IOL  Gestational hypertension    - BPs stable; no neural s/s             Gestational Thrombocytopenia    - platelets improving              Routine postoperative care              See lactation prior to discharge  Discharge home today  [facility] discharge booklet given and reviewed   Smart start  nurse to f/u in 1 week for BP check - scheduled prior to discharge  Lars Pinks, MSN, CNM [facility]

## 2017-08-01 NOTE — Plan of Care (Signed)
Progressing appropriately. Encouraged to call for assistance with feeding as needed, and for [facility] assessment.

## 2017-08-01 NOTE — Discharge Summary (Signed)
Obstetric Discharge Summary   Patient Name: Bethany Wong DOB: 11-27-1975 MRN: [id number]  Date of Admission: 07/27/2017 Date of Discharge: 08/01/2017 Date of Delivery: 07/29/2017 Gestational Age at Delivery: [redacted]w[redacted]d  Primary OB: [facility] - Dr. Benjie Karvonen   Antepartum complications:  - IVF pregnancy  - Gestational hypertension  - Gestational thrombocytopenia  - AMA - Fibroid uterus - Maternal IDA Prenatal Labs:  ABO, Rh: --/--/O POS (02/28 1717) Antibody: NEG (02/28 1717) Rubella:   RPR: Nonreactive, Nonreactive (02/28 1842)  HBsAg: Negative (02/28 1841)  HIV: Non-reactive (02/28 1842)  GBS: Negative (02/28 1841)   Admitting Diagnosis: IOL for gestational hypertension   Secondary Diagnoses: Patient Active Problem List   Diagnosis Date Noted  . Thrombocytopenia ([place]) 07/30/2017  . Cesarean delivery delivered- Indication: failed IOL 3/2 07/29/2017  . Postpartum care following cesarean delivery 07/29/2017  . PIH (pregnancy induced hypertension) 07/27/2017  . DENTAL CARIES 06/30/2010    Augmentation: Cytotec and Pitocin  Complications: None  Date of Delivery: 07/29/2017 Delivered By: Dr. Pamala Hurry  Delivery Type: primary cesarean section, low transverse incision Anesthesia: spinal   Newborn Data: Live born female  Birth Weight: 7 lb 6.5 oz (3360 g) APGAR: 6, 6  Newborn Delivery   Birth date/time:  07/29/2017 17:03:00 Delivery type:  C-Section, Low Transverse C-section categorization:  Primary    Hospital/Postpartum Course  (Cesarean Section):  Pt. Presented for IOL for gestational hypertension.  She S/p cytotec x 4, pitocin x 11 hrs (up to 20 munits) without cervical change.  She had a primary LTCS for failed induction.  Patient had an uncomplicated postpartum course.  By time of discharge on POD#3, her pain was controlled on oral pain medications; she had appropriate lochia and was ambulating, voiding without difficulty, tolerating regular diet and passing flatus.    She was deemed stable for discharge to home.     Labs: CBC Latest Ref Rng & Units 07/30/2017 07/29/2017 07/28/2017  WBC 4.0 - 10.5 K/uL 11.9(H) 6.8 8.2  Hemoglobin 12.0 - 15.0 g/dL 11.0(L) 12.4 12.3  Hematocrit 36.0 - 46.0 % 32.5(L) 37.0 37.1  Platelets 150 - 400 K/uL 123(L) 578(I) 696(E)   Conflict (See Lab Report): O POS/O POS Performed at [facility], [address], [place], [place] [address]   Physical exam:  BP 137/79 (BP Location: Right Arm)   Pulse 79   Temp 98.5 F (36.9 C) (Oral)   Resp 18   Ht 5\' 6"  (1.676 m)   Wt 230 lb (104.3 kg)   SpO2 100%   Breastfeeding? Unknown   BMI 37.12 kg/m  General: alert and no distress Pulm: normal respiratory effort Lochia: appropriate Abdomen: soft, NT Uterine Fundus: firm, below umbilicus Perineum: healing well, no significant erythema, no significant edema Incision: c/d/i, healing well, no significant drainage, no dehiscence, no significant erythema Extremities: No evidence of DVT seen on physical exam. +2 BLE lower extremity edema.   Disposition: stable, discharge to home Baby Feeding: breast milk and formula Baby Disposition: home with mom  Contraception: unsure; IVF pregnancy   Rh Immune globulin given: N/A Rubella vaccine given: N/A Tdap vaccine given in AP or PP setting: UTD Flu vaccine given in AP or PP setting: UTD   Plan:  Bethany Wong was discharged to home in good condition. Follow-up appointment at [facility] in 6 weeks for postpartum visit.  1 week check with [facility] nurse for blood pressure check.   Discharge Instructions: Per After Visit Summary. Refer to After Visit Summary and [facility] discharge  booklet  Activity: Advance as tolerated. Pelvic rest for 6 weeks.   Diet: Regular, Heart Healthy Discharge Medications: Allergies as of 08/01/2017      Reactions   Sulfa Antibiotics Hives, Swelling   Sulfonamide Derivatives    REACTION: hives      Medication List    TAKE these  medications   acetaminophen 325 MG tablet Commonly known as:  TYLENOL Take 2 tablets (650 mg total) by mouth every 4 (four) hours as needed (for pain scale < 4).   ibuprofen 600 MG tablet Commonly known as:  ADVIL,MOTRIN Take 1 tablet (600 mg total) by mouth every 6 (six) hours.   OVER THE COUNTER MEDICATION Take 1 tablet by mouth daily. Vitamin C plus Iron supplement   prenatal multivitamin Tabs tablet Take 1 tablet by mouth daily at 12 noon.      Outpatient follow up:  Follow-up Information    Azucena Fallen, MD Follow up in 1 week(s).   Specialty:  Obstetrics and Gynecology Why:  Blood pressure check with [facility] nurse; then 6 week postpartum visit  Contact information: [address] [address] [phone]           Signed:  Lars Pinks, MSN, CNM [facility]

## 2017-08-01 NOTE — Lactation Note (Signed)
This note was copied from a baby's chart. Lactation Consultation Note  Patient Name: [name] [id number] Date: 08/01/2017 Reason for consult: Follow-up assessment   Baby 11 hours old and mother wants to breastfeed and formula feed. Discussed stimulating breasts at least 8x per day to establish milk supply with either pumping or breastfeeding. Mom encouraged to feed baby 8-12 times/24 hours and with feeding cues.  Reviewed engorgement care and monitoring voids/stools. Suggest breastfeeding before offering formula.   Maternal Data    Feeding Feeding Type: Breast Milk with Formula added  LATCH Score                   Interventions    Lactation Tools Discussed/Used     Consult Status Consult Status: Complete    Carlye Grippe 08/01/2017, 9:31 AM

## 2017-08-04 NOTE — Addendum Note (Signed)
Addendum  created 08/04/17 0913 by Catalina Gravel, MD   Intraprocedure Staff edited

## 2017-12-07 IMAGING — RF DG HYSTEROGRAM
6 series · 6 of 6 positions shown · IV contrast (omnipaque)
Comparison: None.

CLINICAL DATA: Infertility

EXAM:
HYSTEROSALPINGOGRAM
TECHNIQUE: Following cleansing of the cervix and vagina with Betadine solution,
a hysterosalpingogram was performed using a 5-French
hysterosalpingogram catheter and Omnipaque 300 contrast. The patient
tolerated the examination without difficulty.

[Series 1: run · 1 of 1 slices shown (1 of 6)]
[im 1/1]
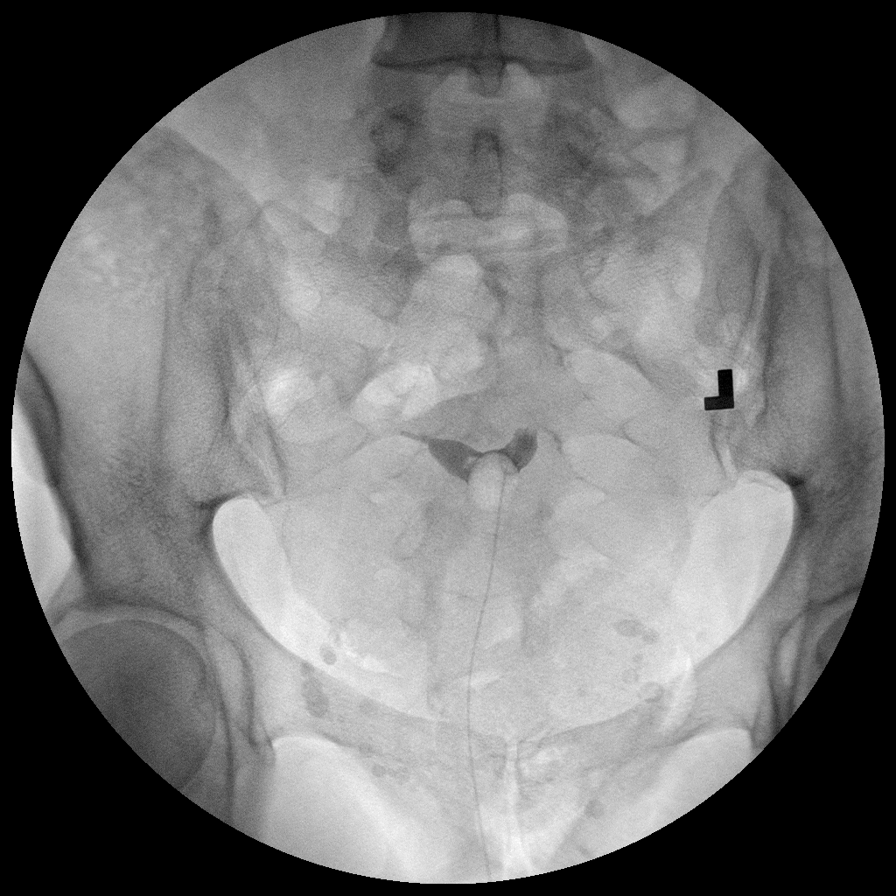

[Series 2: run · 1 of 1 slices shown (2 of 6)]
[im 1/1]
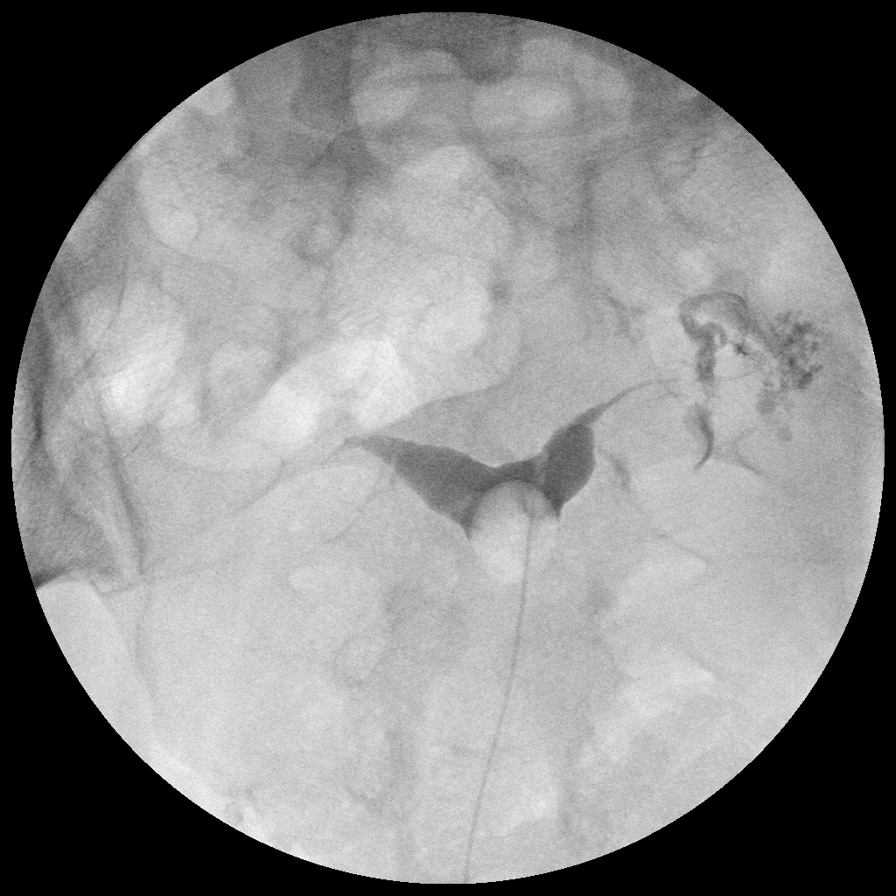

[Series 3: run · 1 of 1 slices shown (3 of 6)]
[im 1/1]
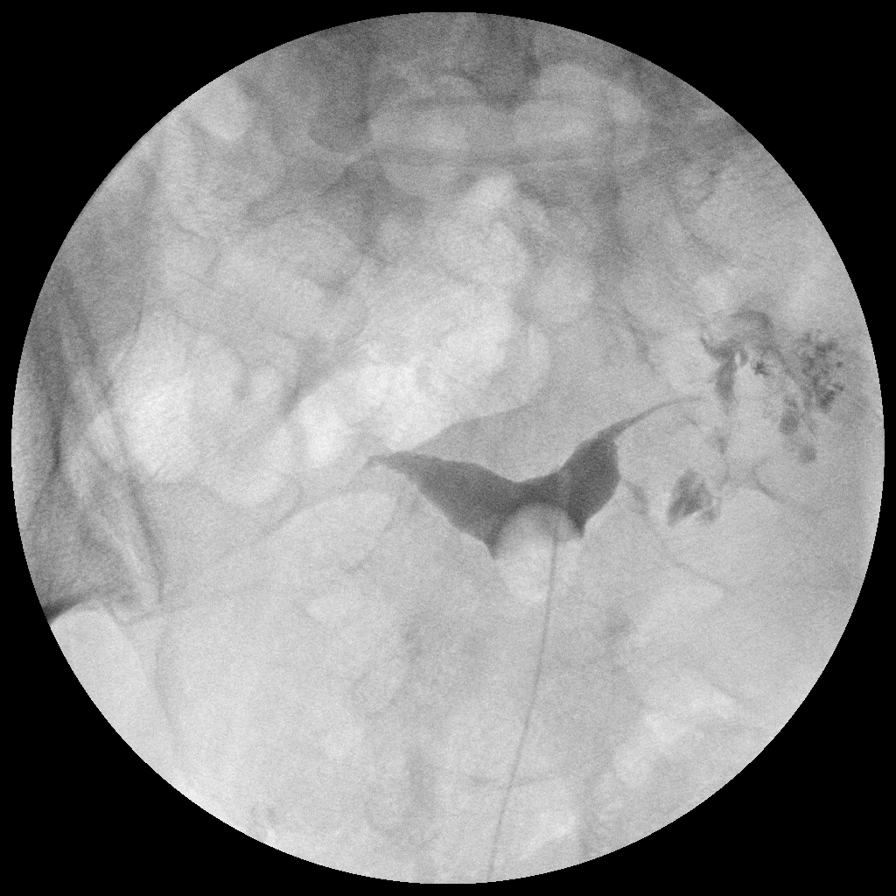

[Series 4: run · 1 of 1 slices shown (4 of 6)]
[im 1/1]
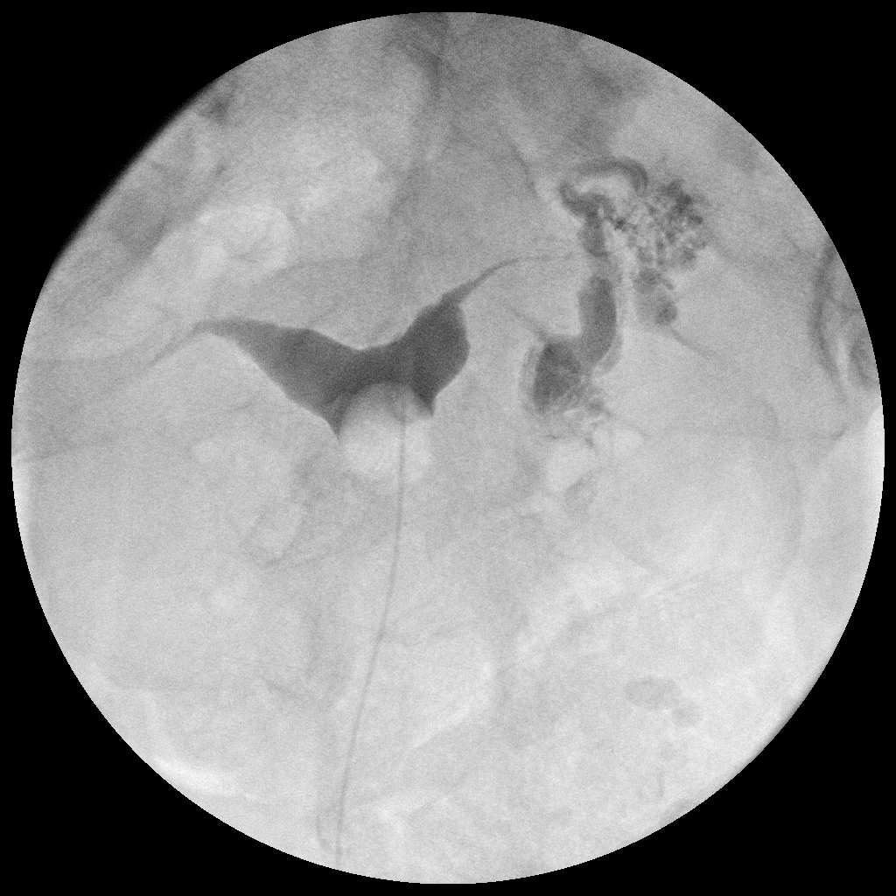

[Series 5: run · 1 of 1 slices shown (5 of 6)]
[im 1/1]
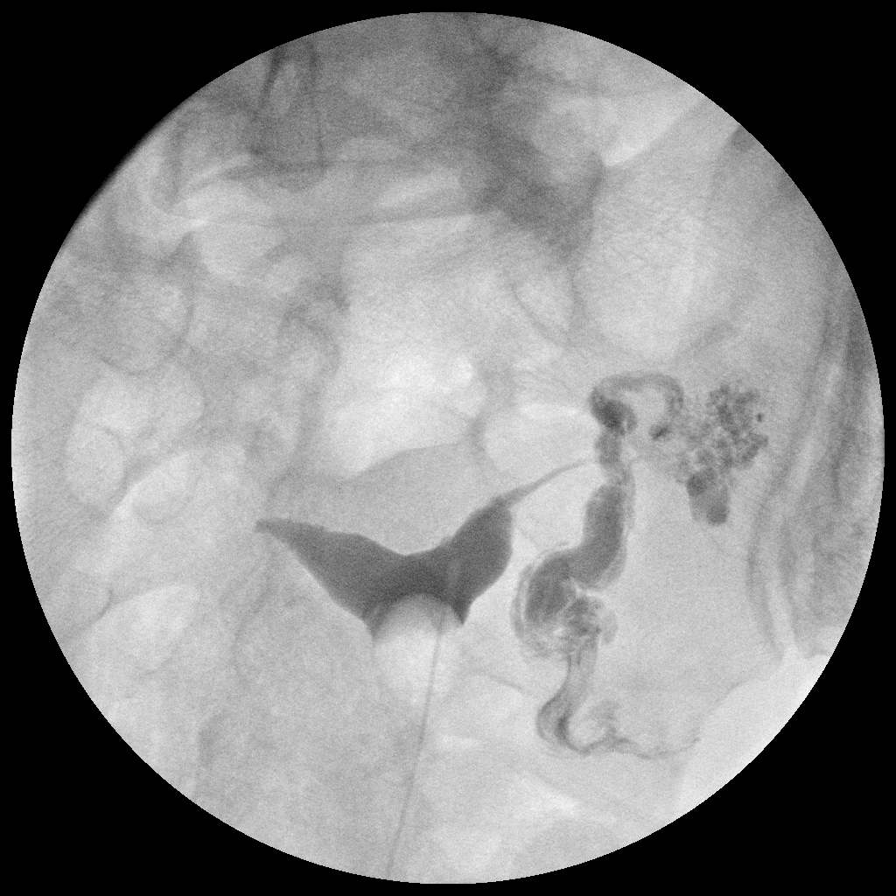

[Series 6: run · 1 of 1 slices shown (6 of 6)]
[im 1/1]
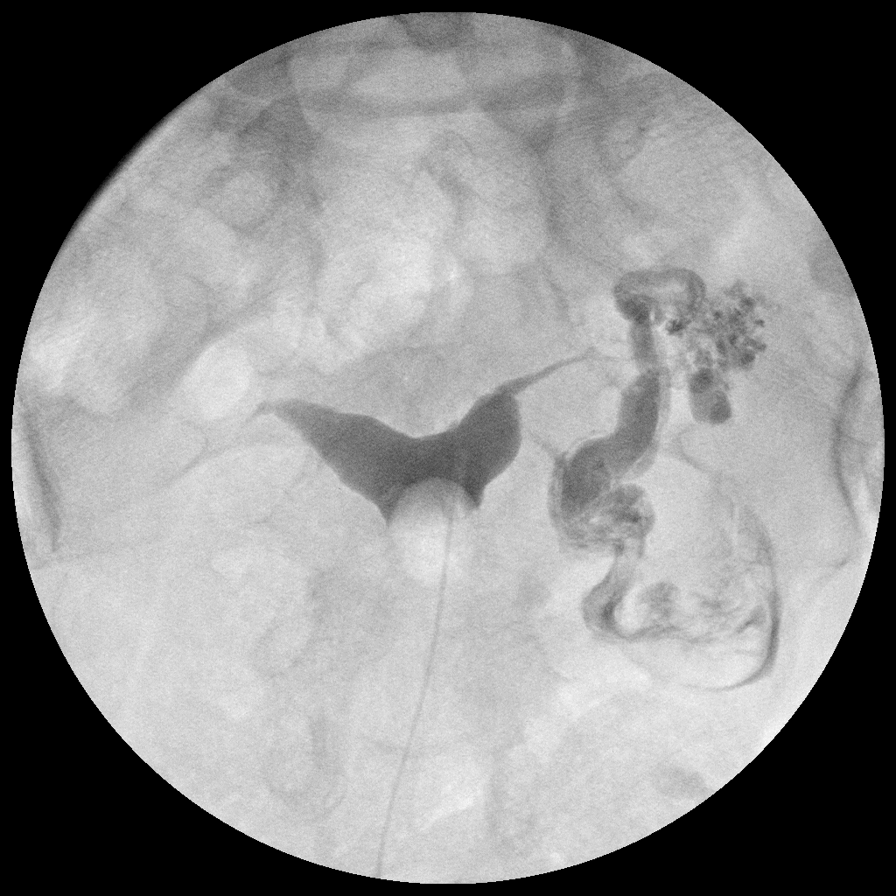

[6 of 6 positions shown; findings below may reference images not displayed]

FLUOROSCOPY TIME:  Fluoroscopy Time:  1 minutes 6 seconds

Number of Acquired Images:  6
FINDINGS: Normal appearance of the uterine cavity.

Proximal occlusion of the right fallopian tube at the cornua.

Dilated mid/distal left fallopian tube. Although delayed, some
peritoneal spill was present on the left, indicating patency.
IMPRESSION: Dilated mid/distal left fallopian tube. Although delayed, the left
fallopian tube was patent.

Proximal occlusion of the right fallopian tube.
